# Patient Record
Sex: Female | Born: 1955 | Race: Black or African American | Hispanic: No | Marital: Married | Smoking: Never smoker
Health system: Southern US, Community
[De-identification: ages and names within clinical notes are randomized; demographics above are authoritative.]

## PROBLEM LIST (undated history)

## (undated) DIAGNOSIS — F32A Depression, unspecified: Secondary | ICD-10-CM

## (undated) DIAGNOSIS — E785 Hyperlipidemia, unspecified: Secondary | ICD-10-CM

## (undated) DIAGNOSIS — F419 Anxiety disorder, unspecified: Secondary | ICD-10-CM

## (undated) HISTORY — PX: TONSILLECTOMY: SUR1361

## (undated) HISTORY — DX: Hyperlipidemia, unspecified: E78.5

## (undated) HISTORY — DX: Anxiety disorder, unspecified: F41.9

## (undated) HISTORY — DX: Depression, unspecified: F32.A

---

## 2000-10-15 ENCOUNTER — Other Ambulatory Visit: Admission: RE | Admit: 2000-10-15 | Discharge: 2000-10-15 | Payer: Self-pay | Admitting: Internal Medicine

## 2000-10-24 ENCOUNTER — Encounter: Payer: Self-pay | Admitting: Internal Medicine

## 2000-10-24 ENCOUNTER — Encounter: Admission: RE | Admit: 2000-10-24 | Discharge: 2000-10-24 | Payer: Self-pay | Admitting: Internal Medicine

## 2001-11-03 ENCOUNTER — Encounter: Payer: Self-pay | Admitting: Internal Medicine

## 2001-11-03 ENCOUNTER — Encounter: Admission: RE | Admit: 2001-11-03 | Discharge: 2001-11-03 | Payer: Self-pay | Admitting: Internal Medicine

## 2004-09-01 ENCOUNTER — Encounter: Admission: RE | Admit: 2004-09-01 | Discharge: 2004-09-01 | Payer: Self-pay | Admitting: Internal Medicine

## 2006-07-09 ENCOUNTER — Encounter: Admission: RE | Admit: 2006-07-09 | Discharge: 2006-07-09 | Payer: Self-pay | Admitting: Internal Medicine

## 2007-05-01 ENCOUNTER — Encounter: Admission: RE | Admit: 2007-05-01 | Discharge: 2007-05-01 | Payer: Self-pay | Admitting: Internal Medicine

## 2007-08-26 ENCOUNTER — Ambulatory Visit (HOSPITAL_COMMUNITY): Admission: RE | Admit: 2007-08-26 | Discharge: 2007-08-26 | Payer: Self-pay | Admitting: Family Medicine

## 2008-05-05 ENCOUNTER — Encounter: Admission: RE | Admit: 2008-05-05 | Discharge: 2008-05-05 | Payer: Self-pay | Admitting: Internal Medicine

## 2008-06-01 ENCOUNTER — Encounter: Admission: RE | Admit: 2008-06-01 | Discharge: 2008-06-01 | Payer: Self-pay | Admitting: Internal Medicine

## 2008-11-19 ENCOUNTER — Encounter: Admission: RE | Admit: 2008-11-19 | Discharge: 2008-11-19 | Payer: Self-pay | Admitting: Internal Medicine

## 2012-07-16 ENCOUNTER — Other Ambulatory Visit: Payer: Self-pay | Admitting: Internal Medicine

## 2012-07-16 DIAGNOSIS — Z1231 Encounter for screening mammogram for malignant neoplasm of breast: Secondary | ICD-10-CM

## 2012-08-04 ENCOUNTER — Ambulatory Visit: Payer: Self-pay

## 2012-08-07 ENCOUNTER — Ambulatory Visit
Admission: RE | Admit: 2012-08-07 | Discharge: 2012-08-07 | Disposition: A | Discharge status: Home / Self Care | Payer: BC Managed Care – PPO | Source: Ambulatory Visit | Attending: Internal Medicine | Admitting: Internal Medicine

## 2012-08-07 DIAGNOSIS — Z1231 Encounter for screening mammogram for malignant neoplasm of breast: Secondary | ICD-10-CM

## 2013-08-14 ENCOUNTER — Other Ambulatory Visit: Payer: Self-pay

## 2013-08-14 DIAGNOSIS — Z1231 Encounter for screening mammogram for malignant neoplasm of breast: Secondary | ICD-10-CM

## 2013-09-04 ENCOUNTER — Ambulatory Visit: Payer: BC Managed Care – PPO

## 2013-09-23 ENCOUNTER — Ambulatory Visit
Admission: RE | Admit: 2013-09-23 | Discharge: 2013-09-23 | Disposition: A | Discharge status: Home / Self Care | Payer: BC Managed Care – PPO | Source: Ambulatory Visit

## 2013-09-23 DIAGNOSIS — Z1231 Encounter for screening mammogram for malignant neoplasm of breast: Secondary | ICD-10-CM

## 2013-12-04 ENCOUNTER — Encounter: Payer: Self-pay | Admitting: Internal Medicine

## 2014-02-03 ENCOUNTER — Encounter: Payer: BC Managed Care – PPO | Admitting: Internal Medicine

## 2014-11-03 ENCOUNTER — Other Ambulatory Visit: Payer: Self-pay

## 2014-11-03 DIAGNOSIS — Z1231 Encounter for screening mammogram for malignant neoplasm of breast: Secondary | ICD-10-CM

## 2014-11-08 ENCOUNTER — Ambulatory Visit
Admission: RE | Admit: 2014-11-08 | Discharge: 2014-11-08 | Disposition: A | Discharge status: Home / Self Care | Payer: BC Managed Care – PPO | Source: Ambulatory Visit

## 2014-11-08 DIAGNOSIS — Z1231 Encounter for screening mammogram for malignant neoplasm of breast: Secondary | ICD-10-CM

## 2015-02-16 ENCOUNTER — Other Ambulatory Visit: Payer: Self-pay | Admitting: General Surgery

## 2015-03-14 ENCOUNTER — Ambulatory Visit (HOSPITAL_COMMUNITY): Admission: RE | Admit: 2015-03-14 | Payer: BC Managed Care – PPO | Source: Ambulatory Visit | Admitting: General Surgery

## 2015-03-14 ENCOUNTER — Encounter (HOSPITAL_COMMUNITY): Admission: RE | Payer: Self-pay | Source: Ambulatory Visit

## 2015-03-14 SURGERY — BREATH TEST, FOR HELICOBACTER PYLORI

## 2015-03-19 ENCOUNTER — Ambulatory Visit: Payer: BC Managed Care – PPO | Admitting: Dietician

## 2015-04-04 ENCOUNTER — Ambulatory Visit (HOSPITAL_COMMUNITY): Payer: BC Managed Care – PPO

## 2015-04-04 ENCOUNTER — Ambulatory Visit (HOSPITAL_COMMUNITY): Admission: RE | Admit: 2015-04-04 | Payer: BC Managed Care – PPO | Source: Ambulatory Visit

## 2015-04-04 ENCOUNTER — Other Ambulatory Visit (HOSPITAL_COMMUNITY): Payer: BC Managed Care – PPO

## 2015-04-06 ENCOUNTER — Encounter: Payer: BC Managed Care – PPO | Attending: General Surgery | Admitting: Dietician

## 2015-04-06 ENCOUNTER — Encounter: Payer: Self-pay | Admitting: Dietician

## 2015-04-06 VITALS — Ht 68.5 in | Wt 246.6 lb

## 2015-04-06 DIAGNOSIS — Z713 Dietary counseling and surveillance: Secondary | ICD-10-CM | POA: Insufficient documentation

## 2015-04-06 DIAGNOSIS — Z6836 Body mass index (BMI) 36.0-36.9, adult: Secondary | ICD-10-CM | POA: Insufficient documentation

## 2015-04-06 DIAGNOSIS — E669 Obesity, unspecified: Secondary | ICD-10-CM | POA: Diagnosis present

## 2015-04-06 NOTE — Progress Notes (Signed)
  Pre-Op Assessment Visit:  Pre-Operative RYGB Surgery  Medical Nutrition Therapy:  Appt start time: 2549   End time:  8264.  Patient was seen on 04/06/2015 for Pre-Operative Nutrition Assessment. Assessment and letter of approval faxed to [facility] Bariatric Surgery Program coordinator on 04/06/2015.   Preferred Learning Style:   No preference indicated   Learning Readiness:   Ready  Handouts given during visit include:  Pre-Op Goals Bariatric Surgery Protein Shakes   During the appointment today the following Pre-Op Goals were reviewed with the patient: Maintain or lose weight as instructed by your surgeon Make healthy food choices Begin to limit portion sizes Limited concentrated sugars and fried foods Keep fat/sugar in the single digits per serving on   food labels Practice CHEWING your food  (aim for 30 chews per bite or until applesauce consistency) Practice not drinking 15 minutes before, during, and 30 minutes after each meal/snack Avoid all carbonated beverages  Avoid/limit caffeinated beverages  Avoid all sugar-sweetened beverages Consume 3 meals per day; eat every 3-5 hours Make a list of non-food related activities Aim for 64-100 ounces of FLUID daily  Aim for at least 60-80 grams of PROTEIN daily Look for a liquid protein source that contain ?15 g protein and ?5 g carbohydrate  (ex: shakes, drinks, shots)  Patient-Centered Goals: Judene would like to learn how to swim, prevent illness, and do more activities that she hasn't be able to do before.   9 level of confidence/10 level of importance  Demonstrated degree of understanding via:  Teach Back  Teaching Method Utilized:  Visual Auditory Hands on  Barriers to learning/adherence to lifestyle change: none  Patient to call the [facility] to enroll in Pre-Op and Post-Op Nutrition Education when surgery date is scheduled.

## 2015-04-06 NOTE — Patient Instructions (Signed)

## 2015-04-14 ENCOUNTER — Ambulatory Visit (HOSPITAL_COMMUNITY)
Admission: RE | Admit: 2015-04-14 | Discharge: 2015-04-14 | Disposition: A | Discharge status: Home / Self Care | Payer: BC Managed Care – PPO | Source: Ambulatory Visit | Attending: General Surgery | Admitting: General Surgery

## 2015-04-14 ENCOUNTER — Other Ambulatory Visit: Payer: Self-pay

## 2015-04-14 DIAGNOSIS — K8020 Calculus of gallbladder without cholecystitis without obstruction: Secondary | ICD-10-CM | POA: Insufficient documentation

## 2015-04-14 DIAGNOSIS — K769 Liver disease, unspecified: Secondary | ICD-10-CM | POA: Diagnosis not present

## 2015-04-14 DIAGNOSIS — K760 Fatty (change of) liver, not elsewhere classified: Secondary | ICD-10-CM | POA: Insufficient documentation

## 2015-04-14 DIAGNOSIS — Z6836 Body mass index (BMI) 36.0-36.9, adult: Secondary | ICD-10-CM | POA: Insufficient documentation

## 2015-04-14 DIAGNOSIS — D259 Leiomyoma of uterus, unspecified: Secondary | ICD-10-CM | POA: Insufficient documentation

## 2015-04-22 ENCOUNTER — Ambulatory Visit (HOSPITAL_COMMUNITY)
Admission: RE | Admit: 2015-04-22 | Discharge: 2015-04-22 | Disposition: A | Discharge status: Home / Self Care | Payer: BC Managed Care – PPO | Source: Ambulatory Visit | Attending: General Surgery | Admitting: General Surgery

## 2015-04-22 ENCOUNTER — Encounter (HOSPITAL_COMMUNITY)
Admission: RE | Disposition: A | Discharge status: Home / Self Care | Payer: BC Managed Care – PPO | Source: Ambulatory Visit

## 2015-04-22 DIAGNOSIS — K219 Gastro-esophageal reflux disease without esophagitis: Secondary | ICD-10-CM | POA: Insufficient documentation

## 2015-04-22 HISTORY — PX: BREATH TEK H PYLORI: SHX5422

## 2015-04-22 SURGERY — BREATH TEST, FOR HELICOBACTER PYLORI

## 2015-04-22 NOTE — Progress Notes (Signed)
   04/22/15 0935  BREATH TEK ASSESSMENT  Referring MD Greer Pickerel  Time of Last PO Intake 1200 (04-21-15)  Baseline Breath At: 0754  Pranactin Given At: 4035  Post-Dose Breath At: 0810  Sample 1 2.2  Sample 2 2.5  Test Postive

## 2015-04-25 ENCOUNTER — Encounter (HOSPITAL_COMMUNITY): Payer: Self-pay | Admitting: General Surgery

## 2015-10-24 ENCOUNTER — Other Ambulatory Visit: Payer: Self-pay

## 2015-10-24 DIAGNOSIS — Z1231 Encounter for screening mammogram for malignant neoplasm of breast: Secondary | ICD-10-CM

## 2015-11-11 ENCOUNTER — Ambulatory Visit
Admission: RE | Admit: 2015-11-11 | Discharge: 2015-11-11 | Disposition: A | Discharge status: Home / Self Care | Payer: BC Managed Care – PPO | Source: Ambulatory Visit

## 2015-11-11 DIAGNOSIS — Z1231 Encounter for screening mammogram for malignant neoplasm of breast: Secondary | ICD-10-CM

## 2015-11-13 HISTORY — PX: HERNIA REPAIR: SHX51

## 2015-11-13 HISTORY — PX: LAPAROSCOPIC GASTRIC RESTRICTIVE DUODENAL PROCEDURE (DUODENAL SWITCH): SHX6667

## 2015-11-13 HISTORY — PX: CHOLECYSTECTOMY: SHX55

## 2016-03-20 DIAGNOSIS — E119 Type 2 diabetes mellitus without complications: Secondary | ICD-10-CM | POA: Insufficient documentation

## 2016-04-20 DIAGNOSIS — Z8669 Personal history of other diseases of the nervous system and sense organs: Secondary | ICD-10-CM | POA: Insufficient documentation

## 2016-04-20 DIAGNOSIS — G4730 Sleep apnea, unspecified: Secondary | ICD-10-CM | POA: Insufficient documentation

## 2016-11-08 ENCOUNTER — Other Ambulatory Visit: Payer: Self-pay | Admitting: Internal Medicine

## 2016-11-08 DIAGNOSIS — Z1231 Encounter for screening mammogram for malignant neoplasm of breast: Secondary | ICD-10-CM

## 2016-11-15 ENCOUNTER — Ambulatory Visit
Admission: RE | Admit: 2016-11-15 | Discharge: 2016-11-15 | Disposition: A | Discharge status: Home / Self Care | Payer: BC Managed Care – PPO | Source: Ambulatory Visit | Attending: Internal Medicine | Admitting: Internal Medicine

## 2016-11-15 DIAGNOSIS — Z1231 Encounter for screening mammogram for malignant neoplasm of breast: Secondary | ICD-10-CM

## 2017-06-06 DIAGNOSIS — R5383 Other fatigue: Secondary | ICD-10-CM

## 2017-06-06 DIAGNOSIS — R001 Bradycardia, unspecified: Secondary | ICD-10-CM

## 2017-06-06 HISTORY — DX: Bradycardia, unspecified: R00.1

## 2017-06-06 HISTORY — DX: Other fatigue: R53.83

## 2017-06-10 ENCOUNTER — Telehealth: Payer: Self-pay | Admitting: Physician Assistant

## 2017-06-10 NOTE — Telephone Encounter (Signed)
Received records from [facility] for appointment on 06/18/17 with Rosaria Ferries, PA.  Records put with Rhonda's schedule for 06/18/17. lp

## 2017-06-18 ENCOUNTER — Encounter: Payer: Self-pay | Admitting: Physician Assistant

## 2017-06-18 ENCOUNTER — Ambulatory Visit (INDEPENDENT_AMBULATORY_CARE_PROVIDER_SITE_OTHER): Payer: BC Managed Care – PPO | Admitting: Physician Assistant

## 2017-06-18 VITALS — BP 114/78 | HR 46 | Ht 68.0 in | Wt 177.0 lb

## 2017-06-18 DIAGNOSIS — R5383 Other fatigue: Secondary | ICD-10-CM

## 2017-06-18 DIAGNOSIS — R001 Bradycardia, unspecified: Secondary | ICD-10-CM

## 2017-06-18 NOTE — Progress Notes (Addendum)
Cardiology Office Note   Date:  06/18/2017   ID:  Valerie Stuart, Valerie Stuart Jun 04, 1956, MRN [id number]  PCP:  Minette Brine  Cardiologist:  New, will be Dr Stacy Gardner, PA-C   Chief Complaint  Patient presents with  . Bradycardia  . Fatigue    History of Present Illness: Valerie Stuart is a 61 y.o. female with a history of morbid obesity s/p duodenal switch w/ bilio-pancreatic diversion and wt loss 80 lbs, HLD, anxiety/depression.   Valerie Stuart presents for cardiology evaluation because of bradycardia.  She has been tired for months. She thought she would feel more energetic after the weight loss, but does not. She has had to pull over because of getting tired and sleepy while driving. The last Time was yesterday, she was driving home to [place] from [place], not a long trip.  She feels the fatigue has gotten worse in the last few months. She has been known to have a low HR, in the 50s, but her heart rate has gotten lower.  She had a colonoscopy 05/20/2017 and her HR was noted to be 41. Her PCP also noted that her HR is running lower than previous, leading to cardiology evaluation.  She had been on Celexa for years, was changed to Trintellix 03/2017 because of lack of effectiveness and decreased libido. She was physically dependent on it, but did not wean off, stopped suddenly. She started the Trintellix at 5 mg, increased to 10 mg but felt more tired and is now back on 5 mg qd. She does not feel as tired on the 5 mg tabs, but the fatigue is still significant. She is aware that depression can cause fatigue, but feels the amount of fatigue is out of proportion to the depression which is improved by the Trintellix.  Her labs are followed by Dr Osvaldo Human at [facility] in [place]. She takes supplements to keep her vitamin levels up.  She does not sleep well. She wakes every morning about 4 am and cannot get back to sleep. She does not have trouble getting to sleep. She  was tested for sleep apnea prior to weight loss and only had a mild case. Her husband no longer mentions her snoring.    Past Medical History:  Diagnosis Date  . Hyperlipidemia     Past Surgical History:  Procedure Laterality Date  . BREATH TEK H PYLORI N/A 04/22/2015   Procedure: BREATH TEK H PYLORI;  Surgeon: Greer Pickerel, MD;  Location: [facility];  Service: General;  Laterality: N/A;  . 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 TONSILLECTOMY      Current Outpatient Prescriptions  Medication Sig Dispense Refill  . calcium citrate (CALCITRATE - DOSED IN MG ELEMENTAL CALCIUM) 950 MG tablet Take 200 mg of elemental calcium by mouth 4 (four) times daily.    . calcium-vitamin D (OSCAL WITH D) 500-200 MG-UNIT tablet Take 1 tablet by mouth daily with breakfast.    . cyanocobalamin 500 MCG tablet Take 500 mcg by mouth daily.    . Multiple Vitamin (MULTIVITAMIN) tablet Take 1 tablet by mouth daily.    Marland Kitchen vortioxetine HBr (TRINTELLIX) 10 MG TABS Take 1 tablet by mouth daily.     No current facility-administered medications for this visit.     Allergies:   Bee venom    Social History:  The patient  reports that she has never smoked. She has never used smokeless  tobacco.   Family History:  The patient's family history includes Alzheimer's disease in her father; Heart disease in her mother; Hypertension in her brother and mother.    ROS:  Please see the history of present illness. All other systems are reviewed and negative.    PHYSICAL EXAM: VS:  BP 114/78 (BP Location: Right Arm)   Pulse (!) 46   Ht 5\' 8"  (1.727 m)   Wt 177 lb (80.3 kg)   BMI 26.91 kg/m  , BMI Body mass index is 26.91 kg/m. GEN: Well nourished, well developed, female in no acute distress  HEENT: normal for age  Neck: no JVD, no carotid bruit, no masses Cardiac: RRR; Very soft murmur, no rubs, or gallops Respiratory:  clear to auscultation  bilaterally, normal work of breathing GI: soft, nontender, nondistended, + BS MS: no deformity or atrophy; no edema; distal pulses are 2+ in all 4 extremities   Skin: warm and dry, no rash Neuro:  Strength and sensation are intact Psych: euthymic mood, full affect   EKG:  EKG is ordered today. The ekg ordered today demonstrates sinus bradycardia, heart rate 46, normal intervals and no ischemic changes   Recent Labs: She had labs recently done at her PCP office No results found for requested labs within last 8760 hours.    Lipid Panel No results found for: CHOL, TRIG, HDL, CHOLHDL, VLDL, LDLCALC, LDLDIRECT   Wt Readings from Last 3 Encounters:  06/18/17 177 lb (80.3 kg)  04/06/15 246 lb 9.6 oz (111.9 kg)     Other studies Reviewed: Additional studies/ records that were reviewed today include: Office notes and Care Everywhere records.  ASSESSMENT AND PLAN: Dr. Gwenlyn Found reviewed the patient data and agrees with the plan.  1.  Fatigue: It is not clear that her heart rate is accounting for the fatigue. I will check other possible causes of fatigue including making sure that she has had a TSH checked. We will do a treadmill to check for chronotropic incompetence which would cause fatigue.  Upon looking up Trintellix, there is a weak association between this medication and bradycardia. It is possible that if she has underlying sinus bradycardia and the medication makes that a little worse, it could contribute to her fatigue. She is also encouraged to see what she can do about getting more sleep. Lack of rest could certainly contribute to fatigue. She does not feel her depression is causing the fatigue.  2. Bradycardia: A heart rate in the 40s could make her a little tired. As above, we will make sure that she does not have chronotropic incompetence. She is not on any medications besides the Trintellix and some vitamins, so I do not have any iatrogenic causes. We will check thyroid if this is  not been done. We will check an echocardiogram to rule out structural heart disease. We will get an event monitor to see if her heart rate is dropping lower overnight. Check a calcium level as well.  Follow up after all data are obtained.   Current medicines are reviewed at length with the patient today.  The patient does not have concerns regarding medicines.  The following changes have been made:  no change  Labs/ tests ordered today include:   Orders Placed This Encounter  Procedures  . Cardiac event monitor  . Exercise Tolerance Test  . EKG 12-Lead  . ECHOCARDIOGRAM COMPLETE     Disposition:   FU with Dr. Gwenlyn Found or Dr. Debara Pickett if she prefers (he sees  her mother)  Augusto Garbe  06/18/2017 9:40 AM    [place] Phone: [phone]; Fax: [phone]  This note was written with the assistance of speech recognition software. Please excuse any transcriptional errors.

## 2017-06-18 NOTE — Progress Notes (Signed)
Labs received from [facility] were drawn 05/28/2017. Vitamin D level was a little low, TSH was within normal limits, A1c lipid profile were within normal limits. No significant abnormalities on complete metabolic profile and CBC.  Lenoard Aden 06/18/2017 5:42 PM [facility] [phone]

## 2017-06-18 NOTE — Patient Instructions (Signed)
Medication Instructions:  Your physician recommends that you continue on your current medications as directed. Please refer to the Current Medication list given to you today. If you need a refill on your cardiac medications before your next appointment, please call your pharmacy.  Labwork: WE WILL CALL YOU IF ANY ARE NEEDED  Testing/Procedures: Your physician has requested that you have an exercise tolerance test. For further information please visit [email or web address]. Please also follow instruction sheet, as given.  Your physician has requested that you have an echocardiogram. Echocardiography is a painless test that uses sound waves to create images of your heart. It provides your doctor with information about the size and shape of your heart and how well your heart's chambers and valves are working. This procedure takes approximately one hour. There are no restrictions for this procedure.  Your physician has recommended that you wear an event monitor. Event monitors are medical devices that record the heart's electrical activity. Doctors most often [place] these monitors to diagnose arrhythmias. Arrhythmias are problems with the speed or rhythm of the heartbeat. The monitor is a small, portable device. You can wear one while you do your normal daily activities. This is usually used to diagnose what is causing palpitations/syncope (passing out).  Follow-Up: Your physician wants you to follow-up in: [place].   Thank you for choosing [facility]!!

## 2017-07-03 ENCOUNTER — Ambulatory Visit (INDEPENDENT_AMBULATORY_CARE_PROVIDER_SITE_OTHER): Payer: BC Managed Care – PPO

## 2017-07-03 ENCOUNTER — Other Ambulatory Visit: Payer: Self-pay

## 2017-07-03 ENCOUNTER — Telehealth (HOSPITAL_COMMUNITY): Payer: Self-pay

## 2017-07-03 ENCOUNTER — Ambulatory Visit (HOSPITAL_COMMUNITY): Payer: BC Managed Care – PPO | Attending: Cardiology

## 2017-07-03 DIAGNOSIS — E785 Hyperlipidemia, unspecified: Secondary | ICD-10-CM | POA: Diagnosis not present

## 2017-07-03 DIAGNOSIS — I071 Rheumatic tricuspid insufficiency: Secondary | ICD-10-CM | POA: Insufficient documentation

## 2017-07-03 DIAGNOSIS — R001 Bradycardia, unspecified: Secondary | ICD-10-CM

## 2017-07-03 NOTE — Telephone Encounter (Signed)
Encounter complete. 

## 2017-07-05 ENCOUNTER — Ambulatory Visit (HOSPITAL_COMMUNITY)
Admission: RE | Admit: 2017-07-05 | Discharge: 2017-07-05 | Disposition: A | Discharge status: Home / Self Care | Payer: BC Managed Care – PPO | Source: Ambulatory Visit | Attending: Internal Medicine | Admitting: Internal Medicine

## 2017-07-05 DIAGNOSIS — R001 Bradycardia, unspecified: Secondary | ICD-10-CM | POA: Diagnosis not present

## 2017-07-05 LAB — EXERCISE TOLERANCE TEST
CHL CUP MPHR: 162 {beats}/min
CSEPPHR: 155 {beats}/min
Estimated workload: 8.5 METS
Exercise duration (min): 7 min
Exercise duration (sec): 0 s
Percent HR: 95 %
RPE: 18
Rest HR: 49 {beats}/min

## 2017-07-31 ENCOUNTER — Ambulatory Visit (INDEPENDENT_AMBULATORY_CARE_PROVIDER_SITE_OTHER): Payer: BC Managed Care – PPO | Admitting: Cardiovascular Disease

## 2017-07-31 ENCOUNTER — Encounter: Payer: Self-pay | Admitting: Cardiovascular Disease

## 2017-07-31 DIAGNOSIS — R001 Bradycardia, unspecified: Secondary | ICD-10-CM | POA: Diagnosis not present

## 2017-07-31 NOTE — Patient Instructions (Signed)
REFER TO DR Sallyanne Kuster TO DISCUSS PACEMAKER  Your physician wants you to follow-up in: 6 MONTHS WITH DR Gwenlyn Found You will receive a reminder letter in the mail two months in advance. If you don't receive a letter, please call our office to schedule the follow-up appointment.   If you need a refill on your cardiac medications before your next appointment, please call your pharmacy.

## 2017-07-31 NOTE — Assessment & Plan Note (Addendum)
Valerie Stuart was referred for fatigue. She has had bariatric surgery with an excellent result (80 pound weight loss). She saw Rosaria Ferries in the office for initial consultation on 06/18/17. Routine GXT was normal as was a 2-D echo. Monitor showed episodes of sinus bradycardia without minimal heart rate of 38 and frequent heart rates in the low 40s. She is on no weight low lowering drugs. Is possible that her bradycardia is contributing to her fatigue. I'm going to refer her to Dr. Sallyanne Kuster for discussion regarding the possibility of permanent transvenous pacemaker. It should be noted that her TSH was normal on recent labs ordered by her PCP.

## 2017-07-31 NOTE — Progress Notes (Signed)
07/31/2017 Orion Modest   03-14-1956  [id number]  Primary Physician Minette Brine Primary Cardiologist: Lorretta Harp MD Lupe Carney, [place]  HPI:  Valerie Stuart is a 61 y.o. 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 She was referred by Minette Brine NP/Dr  . Bryon Lions for cardiovascular evaluation initially because of fatigue. She has basically no cardiac risk factors. She has had bariatric surgery and has lost 80 pounds. She saw Rosaria Ferries PA-C in the office on 06/18/17 who ordered a routine GXT which was normal as was a 2-D echo. Event monitor showed heart rates as low as 38 with frequent heart rates in the low 40s. She is on no rate lowering medications. She was tested for sleep apnea prior to bariatric surgery and this was minimally positive.   Current Meds  Medication Sig  . calcium citrate (CALCITRATE - DOSED IN MG ELEMENTAL CALCIUM) 950 MG tablet Take 200 mg of elemental calcium by mouth 4 (four) times daily.  . calcium-vitamin D (OSCAL WITH D) 500-200 MG-UNIT tablet Take 1 tablet by mouth daily with breakfast.  . Cholecalciferol (D-3-5) 5000 units capsule Take 5,000 Units by mouth daily.  . cyanocobalamin 500 MCG tablet Take 500 mcg by mouth daily.  Marland Kitchen IRON-B12-VIT C-FA-IFC PO Take 29 mg by mouth daily.  . Multiple Vitamin (MULTIVITAMIN) tablet Take 1 tablet by mouth daily.  . vitamin A 10000 UNIT capsule Take 10,000 Units by mouth daily.  Marland Kitchen VITAMIN E PO Take 300 Units by mouth daily.  Marland Kitchen vortioxetine HBr (TRINTELLIX) 10 MG TABS Take 1 tablet by mouth daily.     Allergies  Allergen Reactions  . Bee Venom Anaphylaxis    Social History   Social History  . Marital status: Married    Spouse name: N/A  . Number of children: N/A  . Years of education: N/A   Occupational History  . Checks group homes for compliance [place] History Main Topics  . Smoking status: Never Smoker   . Smokeless tobacco: Never Used  . Alcohol use No  . Drug use: No  . Sexual activity: Yes   Other Topics Concern  . Not on file   Social History Narrative  . No narrative on file     Review of Systems: General: negative for chills, fever, night sweats or weight changes.  Cardiovascular: negative for chest pain, dyspnea on exertion, edema, orthopnea, palpitations, paroxysmal nocturnal dyspnea or shortness of breath Dermatological: negative for rash Respiratory: negative for cough or wheezing Urologic: negative for hematuria Abdominal: negative for nausea, vomiting, diarrhea, bright red blood per rectum, melena, or hematemesis Neurologic: negative for visual changes, syncope, or dizziness All other systems reviewed and are otherwise negative except as noted above.    Blood pressure 111/72, pulse (!) 55, height 5\' 9"  (1.753 m), weight 173 lb 6.4 oz (78.7 kg), SpO2 100 %.  General appearance: alert and no distress Neck: no adenopathy, no carotid bruit, no JVD, supple, symmetrical, trachea midline and thyroid not enlarged, symmetric, no tenderness/mass/nodules Lungs: clear to auscultation bilaterally Heart: regular rate and rhythm, S1, S2 normal, no murmur, click, rub or gallop Extremities: extremities normal, atraumatic, no cyanosis or edema Pulses: 2+ and symmetric Skin: Skin color, texture, turgor normal. No rashes or lesions Neurologic: Alert and oriented X 3, normal strength and tone. Normal symmetric reflexes. Normal coordination and gait  EKG not performed today  ASSESSMENT AND PLAN:  Bradycardia, unspecified Ms. Valerie Stuart was referred for fatigue. She has had bariatric surgery with an excellent result (80 pound weight loss). She saw Rosaria Ferries in the office for initial consultation on 06/18/17. Routine GXT was normal as was a 2-D echo. Monitor showed episodes of sinus bradycardia without minimal heart rate of 38 and frequent heart rates in the low 40s. She is on no weight  low lowering drugs. Is possible that her bradycardia is contributing to her fatigue. I'm going to refer her to Dr. Sallyanne Kuster for discussion regarding the possibility of permanent transvenous pacemaker. It should be noted that her TSH was normal on recent labs ordered by her PCP.      Lorretta Harp MD FACP,FACC,FAHA, [facility] 07/31/2017 9:44 AM

## 2017-08-05 ENCOUNTER — Ambulatory Visit (INDEPENDENT_AMBULATORY_CARE_PROVIDER_SITE_OTHER): Payer: BC Managed Care – PPO | Admitting: Cardiovascular Disease

## 2017-08-05 ENCOUNTER — Encounter: Payer: Self-pay | Admitting: Cardiovascular Disease

## 2017-08-05 VITALS — BP 130/72 | HR 53 | Ht 69.0 in | Wt 174.0 lb

## 2017-08-05 DIAGNOSIS — R001 Bradycardia, unspecified: Secondary | ICD-10-CM

## 2017-08-05 NOTE — Patient Instructions (Signed)
Dr Sallyanne Kuster recommends that you schedule a follow-up appointment in 6 months. You will receive a reminder letter in the mail two months in advance. If you don't receive a letter, please call our office to schedule the follow-up appointment.  If you need a refill on your cardiac medications before your next appointment, please call your pharmacy.   Kardia device - [email or web address]

## 2017-08-05 NOTE — Progress Notes (Signed)
Cardiology Office Note:    Date:  08/06/2017   ID:  [name], DOB [birth year], MRN [id number]  PCP:  Minette Brine  Cardiologist: Quay Burow, M.D.; Sanda Klein, MD    Referring MD: Minette Brine, FNP   Chief Complaint  Patient presents with  . Follow-up  Discussed pacemaker  History of Present Illness:    Valerie Stuart is a 61 y.o. female with a hx of Persistent sinus bradycardia and complaints of fatigue, referred for Dr. Gwenlyn Found to discuss pacemaker implantation.  Mrs. Burgoon underwent bariatric surgery (duodenal switch) procedure about a year ago and has successfully lost a large amount of weight, at least 80 pounds. She had been hoping that weight loss would lead to substantial boost in her energy and resolution of fatigue. This has not happened. She does not have daytime hypersomnolence, does not snore and does not have significant obstructive sleep apnea (sleep study performed before her weight loss was "minimally positive"). She has normal thyroid function and is not anemic.  Recent event monitor shows persistent sinus bradycardia including during daytime hours. The minimum recorded heart rate was 38 and she frequently had heart rates in the 40s even during the daytime. No significant pauses were recorded.  However, during recent exercise testing she achieved a maximum heart rate of 155 bpm (95% of maximum predicted) relatively easily. Her stress test was otherwise normal. She was able to exercise for 7 minutes (8.5 mets). She had a normal echocardiogram in August.  Past Medical History:  Diagnosis Date  . Hyperlipidemia     Past Surgical History:  Procedure Laterality Date  . BREATH TEK H PYLORI N/A 04/22/2015   Procedure: BREATH TEK H PYLORI;  Surgeon: Greer Pickerel, MD;  Location: [facility];  Service: General;  Laterality: N/A;  . 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 TONSILLECTOMY       Current Medications: Current Meds  Medication Sig  . calcium citrate (CALCITRATE - DOSED IN MG ELEMENTAL CALCIUM) 950 MG tablet Take 200 mg of elemental calcium by mouth 4 (four) times daily.  . calcium-vitamin D (OSCAL WITH D) 500-200 MG-UNIT tablet Take 1 tablet by mouth daily with breakfast.  . Cholecalciferol (D-3-5) 5000 units capsule Take 5,000 Units by mouth daily.  . cyanocobalamin 500 MCG tablet Take 500 mcg by mouth daily.  Marland Kitchen IRON-B12-VIT C-FA-IFC PO Take 29 mg by mouth daily.  . Multiple Vitamin (MULTIVITAMIN) tablet Take 1 tablet by mouth daily.  . vitamin A 10000 UNIT capsule Take 10,000 Units by mouth daily.  Marland Kitchen VITAMIN E PO Take 300 Units by mouth daily.  Marland Kitchen vortioxetine HBr (TRINTELLIX) 10 MG TABS Take 1 tablet by mouth daily.     Allergies:   Bee venom   Social History   Social History  . Marital status: Married    Spouse name: N/A  . Number of children: N/A  . Years of education: N/A   Occupational History  . 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 Smoking status: Never Smoker  . Smokeless tobacco: Never Used  . Alcohol use No  . Drug use: No  . Sexual activity: Yes   Other Topics Concern  . None   Social History Narrative  . None     Family History: The patient's family history includes Alzheimer's disease in her father; Heart disease in her mother; Hypertension in her brother and mother.  ROS:   Please see the history of present illness.     All other systems reviewed and are negative.  EKGs/Labs/Other Studies Reviewed:    The following studies were reviewed today:   EKG:  EKG is  ordered today.  The ekg ordered today demonstrates sinus bradycardia 53 bpm, otherwise normal tracing.  Recent Labs: No results found for requested labs within last 8760 hours.  Recent Lipid Panel No results found for: CHOL, TRIG, HDL, CHOLHDL, VLDL, LDLCALC, LDLDIRECT  Physical Exam:    VS:  BP 130/72    Pulse (!) 53   Ht 5\' 9"  (1.753 m)   Wt 174 lb (78.9 kg)   BMI 25.70 kg/m     Wt Readings from Last 3 Encounters:  08/05/17 174 lb (78.9 kg)  07/31/17 173 lb 6.4 oz (78.7 kg)  06/18/17 177 lb (80.3 kg)     GEN:  Well nourished, well developed in no acute distress HEENT: Normal NECK: No JVD; No carotid bruits LYMPHATICS: No lymphadenopathy CARDIAC: RRR, no murmurs, rubs, gallops RESPIRATORY:  Clear to auscultation without rales, wheezing or rhonchi  ABDOMEN: Soft, non-tender, non-distended MUSCULOSKELETAL:  No edema; No deformity  SKIN: Warm and dry NEUROLOGIC:  Alert and oriented x 3 PSYCHIATRIC:  Normal affect   ASSESSMENT:    No diagnosis found. PLAN:    In order of problems listed above:  1. Sinus bradycardia - Although she clearly has significant resting bradycardia, she does not have any evidence of chronotropic incompetence. She has not had syncope or near syncope. None of her medications have negative chronotropic effect. Normal recent thyroid tests. It is highly likely that her sinus bradycardia is a consequence of gastric bypass surgery. This is a fairly common occurrence and generally does not require treatment. It often correlates with rapid weight loss. There are reports of successful treatment with anti-cholinergic such as scopolamine patch. She is reluctant to consider implantation of a permanent pacemaker at such a young age. Also, I could not promise that the device would lead to improved symptoms. We did review the procedure in detail so that she understands the pros and cons. I would ask her to consider repeat evaluation if she develops clear-cut syncope or near-syncope.   Medication Adjustments/Labs and Tests Ordered: Current medicines are reviewed at length with the patient today.  Concerns regarding medicines are outlined above.  No orders of the defined types were placed in this encounter.  No orders of the defined types were placed in this  encounter.   Signed, Sanda Klein, MD  08/06/2017 9:04 AM    [facility]

## 2017-08-16 ENCOUNTER — Ambulatory Visit: Payer: BC Managed Care – PPO | Admitting: Cardiovascular Disease

## 2017-08-28 ENCOUNTER — Ambulatory Visit: Payer: BC Managed Care – PPO | Admitting: Cardiovascular Disease

## 2017-09-03 ENCOUNTER — Ambulatory Visit: Payer: BC Managed Care – PPO | Admitting: Cardiovascular Disease

## 2018-05-27 ENCOUNTER — Other Ambulatory Visit: Payer: Self-pay | Admitting: Internal Medicine

## 2018-05-27 DIAGNOSIS — Z1231 Encounter for screening mammogram for malignant neoplasm of breast: Secondary | ICD-10-CM

## 2018-06-17 ENCOUNTER — Ambulatory Visit: Payer: BC Managed Care – PPO

## 2018-07-08 ENCOUNTER — Ambulatory Visit
Admission: RE | Admit: 2018-07-08 | Discharge: 2018-07-08 | Disposition: A | Discharge status: Home / Self Care | Payer: BC Managed Care – PPO | Source: Ambulatory Visit | Attending: Internal Medicine | Admitting: Internal Medicine

## 2018-07-08 DIAGNOSIS — Z1231 Encounter for screening mammogram for malignant neoplasm of breast: Secondary | ICD-10-CM

## 2018-10-03 ENCOUNTER — Ambulatory Visit: Payer: BC Managed Care – PPO | Admitting: Nurse Practitioner

## 2018-10-03 ENCOUNTER — Encounter: Payer: Self-pay | Admitting: Nurse Practitioner

## 2018-10-03 VITALS — BP 122/76 | HR 73 | Temp 97.7°F | Ht 67.0 in | Wt 171.8 lb

## 2018-10-03 DIAGNOSIS — F32A Depression, unspecified: Secondary | ICD-10-CM

## 2018-10-03 DIAGNOSIS — F419 Anxiety disorder, unspecified: Secondary | ICD-10-CM | POA: Diagnosis not present

## 2018-10-03 DIAGNOSIS — Z23 Encounter for immunization: Secondary | ICD-10-CM | POA: Diagnosis not present

## 2018-10-03 DIAGNOSIS — F329 Major depressive disorder, single episode, unspecified: Secondary | ICD-10-CM

## 2018-10-03 NOTE — Patient Instructions (Signed)
Agoraphobia Agoraphobia is a mental health disorder. It is a type of anxiety or fear. People with agoraphobia fear public places where they may be trapped, helpless, or embarrassed in the event of a panic attack or a loss of control. They often start to avoid the feared situations or insist that another person go with them. Agoraphobia may interfere with normal daily activities and personal relationships. People with severe agoraphobia may become completely homebound and dependent on others for grocery shopping and other errands. Agoraphobia usually begins before age 35, but it can start in the older adult years. People with agoraphobia are at risk for other anxiety disorders, depression, and substance abuse. What are the causes? It is not known exactly what causes agoraphobia. What increases the risk? Agoraphobia is more common in women. People who have panic disorder or have family members with agoraphobia are at higher risk of developing agoraphobia. What are the signs or symptoms? You may have agoraphobia if you have the following symptoms for 6 months or longer:  Intense fear about two or more of the following: ? Using public transportation, such as cars, buses, planes, trains, or ships. ? Being in open spaces, such as parking lots, shopping malls, or bridges. ? Being in enclosed spaces, such as shops, theaters, or elevators. ? Standing in line or being in a crowd. ? Being outside the home alone.  Fear that is due to thoughts of being unable to escape or get help if certain events occur. The feared event may be a panic attack or panic-like symptoms, such as a racing heart, dizziness, and trouble breathing. In older people, the feared event may be a fall or loss of bowel control.  Reacting to feared situations by: ? Avoiding them. ? Requiring the presence of a companion. ? Enduring them with intense fear or anxiety.  Fear or anxiety that is out of proportion to the actual danger that is  posed by the event and the situation.  How is this diagnosed? Agoraphobia may be diagnosed by your health care provider. You will be asked questions about your fears and how they have affected you. You may be asked about your medical history and your use of medicines, alcohol, or drugs. Your health care provider may do a physical exam and order lab tests or other studies to rule out a medical condition. You may also be referred to a mental health specialist. How is this treated? Treatment usually includes a combination of counseling and medicines.  Counseling or talk therapy. Talk therapy is provided by mental health specialists. The following forms of talk therapy can be especially helpful: ? Cognitive therapy. Cognitive therapy helps you to recognize and change unrealistic thoughts and beliefs that contribute to your fears. ? Exposure therapy. Exposure therapy helps you to face and overcome your fears in a relaxed state and a safe environment.  Medicines. The following types of medicines may be helpful: ? Antidepressants. Antidepressants are believed to affect certain chemicals in your brain. They can decrease general levels of anxiety and can help to prevent panic attacks. ? Benzodiazepines. These medicines block feelings of anxiety and panic. They are very effective and act more quickly than antidepressants, but they are highly addictive. These medicines are recommended only for short-term use. ? Beta blockers. Beta blockers can reduce physical symptoms of anxiety, such as a racing heart, sweating, and tremor. They may help you to feel less tense and anxious.  Follow these instructions at home:  Keep all follow-up visits as   directed by your health care provider. This is important.  Take all medicines only as directed by your health care provider.  Try to exercise, eat a healthy diet, and get plenty of sleep.  Do not drink alcohol.  Do not use illegal drugs. Where to find more  information: For more information, visit the website of the Anxiety and Depression Association of [employer] (ADAA): www.adaa.org Contact a health care provider if:  Your fear or anxiety gets worse.  You have new fears or anxieties. Get help right away if:  You have serious thoughts about hurting yourself or someone else.  You have trouble breathing or have chest pain. This information is not intended to replace advice given to you by your health care provider. Make sure you discuss any questions you have with your health care provider. Document Released: 03/21/2011 Document Revised: 04/05/2016 Document Reviewed: 03/01/2014 Elsevier Interactive Patient Education  2018 Elsevier Inc.  

## 2018-10-03 NOTE — Progress Notes (Signed)
Subjective:     Patient ID: Valerie Stuart , female    DOB: 1956-09-15 , 62 y.o.   MRN: [id number]   Chief Complaint  Patient presents with  . Anxiety  . URI    HPI  Anxiety  Presents for follow-up visit. Symptoms include insomnia and nervous/anxious behavior. Patient reports no chest pain, depressed mood, dizziness, nausea or palpitations. The quality of sleep is good. Nighttime awakenings: none.    URI   This is a new problem. The current episode started in the past 7 days. There has been no fever. Pertinent negatives include no chest pain, congestion, nausea or sore throat. Associated symptoms comments: Had sorethroat on Saturday, better now. . She has tried sleep for the symptoms. The treatment provided no relief.     Past Medical History:  Diagnosis Date  . Hyperlipidemia      Family History  Problem Relation Age of Onset  . Heart disease Mother   . Hypertension Mother   . Alzheimer's disease Father   . Hypertension Brother      Current Outpatient Medications:  .  calcium-vitamin D (OSCAL WITH D) 500-200 MG-UNIT tablet, Take 1 tablet by mouth daily with breakfast., Disp: , Rfl:  .  Cholecalciferol (D-3-5) 5000 units capsule, Take 5,000 Units by mouth daily., Disp: , Rfl:  .  cyanocobalamin 500 MCG tablet, Take 500 mcg by mouth daily., Disp: , Rfl:  .  IRON-B12-VIT C-FA-IFC PO, Take 29 mg by mouth daily., Disp: , Rfl:  .  Multiple Vitamin (MULTIVITAMIN) tablet, Take 1 tablet by mouth daily., Disp: , Rfl:  .  vitamin A 10000 UNIT capsule, Take 10,000 Units by mouth daily., Disp: , Rfl:  .  VITAMIN E PO, Take 300 Units by mouth daily., Disp: , Rfl:  .  vortioxetine HBr (TRINTELLIX) 10 MG TABS, Take 1 tablet by mouth daily., Disp: , Rfl:  .  calcium citrate (CALCITRATE - DOSED IN MG ELEMENTAL CALCIUM) 950 MG tablet, Take 200 mg of elemental calcium by mouth 4 (four) times daily., Disp: , Rfl:    Allergies  Allergen Reactions  . Bee Venom Anaphylaxis     Review of  Systems  HENT: Negative for congestion and sore throat.   Respiratory: Negative.   Cardiovascular: Negative for chest pain and palpitations.  Gastrointestinal: Negative for nausea.  Skin: Negative.   Neurological: Negative for dizziness and light-headedness.  Psychiatric/Behavioral: Positive for sleep disturbance. Negative for agitation and behavioral problems. The patient is nervous/anxious and has insomnia.      Today's Vitals   10/03/18 0842  BP: 122/76  Pulse: 73  Temp: 97.7 F (36.5 C)  SpO2: 93%  Weight: 171 lb 12.8 oz (77.9 kg)  Height: 5\' 7"  (1.702 m)  PainSc: 0-No pain   Body mass index is 26.91 kg/m.   Objective:  Physical Exam  Constitutional: She is oriented to person, place, and time. She appears well-developed.  Cardiovascular: Normal rate, regular rhythm and normal heart sounds.  Pulmonary/Chest: Effort normal and breath sounds normal.  Neurological: She is alert and oriented to person, place, and time.  Skin: Skin is warm and dry.  Psychiatric: She has a normal mood and affect. Her behavior is normal. Judgment and thought content normal.       Assessment And Plan:     1. Anxiety  Chronic, she is feeling better a little on Trintellix 10 mg  Encouraged to take Magnesium 250 mg with evening meal this may help her with sleep as  well.   2. Depression, unspecified depression type  Chronic  Depression score is 1  She is planning to go to counseling known as [facility]  3. Need for influenza vaccination  Influenza vaccine administered  Encouraged to take Tylenol as needed for fever or muscle aches. - Flu Vaccine QUAD 6+ mos PF IM (Fluarix Quad PF)       Minette Brine, FNP

## 2018-10-30 ENCOUNTER — Other Ambulatory Visit: Payer: Self-pay

## 2018-10-30 MED ORDER — VORTIOXETINE HBR 10 MG PO TABS: 10.0000 mg | ORAL_TABLET | Freq: Every day | ORAL | 1 refills | Status: DC

## 2018-11-21 ENCOUNTER — Encounter: Payer: Self-pay | Admitting: Nurse Practitioner

## 2018-11-21 ENCOUNTER — Ambulatory Visit: Payer: BC Managed Care – PPO | Admitting: Nurse Practitioner

## 2018-11-21 VITALS — BP 110/60 | HR 60 | Temp 97.9°F | Ht 67.0 in | Wt 174.6 lb

## 2018-11-21 DIAGNOSIS — R350 Frequency of micturition: Secondary | ICD-10-CM | POA: Diagnosis not present

## 2018-11-21 LAB — POCT URINALYSIS DIPSTICK
Bilirubin, UA: NEGATIVE
Glucose, UA: NEGATIVE
Ketones, UA: NEGATIVE
NITRITE UA: NEGATIVE
PH UA: 5.5 (ref 5.0–8.0)
Protein, UA: NEGATIVE
SPEC GRAV UA: 1.015 (ref 1.010–1.025)
UROBILINOGEN UA: 0.2 U/dL

## 2018-11-21 MED ORDER — CIPROFLOXACIN HCL 500 MG PO TABS: 500.0000 mg | ORAL_TABLET | Freq: Two times a day (BID) | ORAL | 0 refills | Status: AC

## 2018-11-21 NOTE — Progress Notes (Signed)
Subjective:     Patient ID: Valerie Stuart , female    DOB: 17-Mar-1956 , 63 y.o.   MRN: [id number]   Chief Complaint  Patient presents with  . Dysuria    patient states she took some azo yesterday morning    HPI  Dysuria   This is a new problem. The current episode started yesterday. The problem occurs every urination. The problem has been gradually worsening. Quality: pressure to back area. The pain is at a severity of 3/10. There has been no fever. She is sexually active. There is no history of pyelonephritis. Pertinent negatives include no chills. She has tried nothing for the symptoms. The treatment provided no relief. There is no history of recurrent UTIs.     Past Medical History:  Diagnosis Date  . Hyperlipidemia      Family History  Problem Relation Age of Onset  . Heart disease Mother   . Hypertension Mother   . Alzheimer's disease Father   . Hypertension Brother      Current Outpatient Medications:  .  calcium citrate (CALCITRATE - DOSED IN MG ELEMENTAL CALCIUM) 950 MG tablet, Take 200 mg of elemental calcium by mouth 4 (four) times daily., Disp: , Rfl:  .  calcium-vitamin D (OSCAL WITH D) 500-200 MG-UNIT tablet, Take 1 tablet by mouth daily with breakfast., Disp: , Rfl:  .  Cholecalciferol (D-3-5) 5000 units capsule, Take 5,000 Units by mouth daily., Disp: , Rfl:  .  cyanocobalamin 500 MCG tablet, Take 500 mcg by mouth daily., Disp: , Rfl:  .  IRON-B12-VIT C-FA-IFC PO, Take 29 mg by mouth daily., Disp: , Rfl:  .  Multiple Vitamin (MULTIVITAMIN) tablet, Take 1 tablet by mouth daily., Disp: , Rfl:  .  vitamin A 10000 UNIT capsule, Take 10,000 Units by mouth daily., Disp: , Rfl:  .  VITAMIN E PO, Take 300 Units by mouth daily., Disp: , Rfl:  .  vortioxetine HBr (TRINTELLIX) 10 MG TABS tablet, Take 1 tablet (10 mg total) by mouth daily., Disp: 90 tablet, Rfl: 1   Allergies  Allergen Reactions  . Bee Venom Anaphylaxis     Review of Systems  Constitutional:  Negative for chills and fatigue.  Respiratory: Negative.   Cardiovascular: Negative.   Genitourinary: Positive for dysuria.  Musculoskeletal: Positive for back pain (left low flank pain).     Today's Vitals   11/21/18 1601  BP: 110/60  Pulse: 60  Temp: 97.9 F (36.6 C)  TempSrc: Oral  SpO2: 98%  Weight: 174 lb 9.6 oz (79.2 kg)  Height: 5\' 7"  (1.702 m)  PainSc: 0-No pain   Body mass index is 27.35 kg/m.   Objective:  Physical Exam Constitutional:      Appearance: Normal appearance.  Cardiovascular:     Rate and Rhythm: Normal rate and regular rhythm.     Pulses: Normal pulses.     Heart sounds: Normal heart sounds. No murmur.  Pulmonary:     Effort: Pulmonary effort is normal.     Breath sounds: Normal breath sounds.  Abdominal:     General: Abdomen is flat. Bowel sounds are normal. There is no distension.     Palpations: Abdomen is soft.  Neurological:     General: No focal deficit present.     Mental Status: She is alert.  Psychiatric:        Mood and Affect: Mood normal.         Assessment And Plan:     1.  Urinary frequency  Large leukocytes, will send UA for culture  Encouraged to drink water avoid sodas and tea - POCT Urinalysis Dipstick (81002) - Culture, Urine - ciprofloxacin (CIPRO) 500 MG tablet; Take 1 tablet (500 mg total) by mouth 2 (two) times daily for 3 days.  Dispense: 10 tablet; Refill: 0      Minette Brine, FNP

## 2018-11-21 NOTE — Patient Instructions (Signed)
Urinary Tract Infection, Adult A urinary tract infection (UTI) is an infection of any part of the urinary tract. The urinary tract includes:  The kidneys.  The ureters.  The bladder.  The urethra. These organs make, store, and get rid of pee (urine) in the body. What are the causes? This is caused by germs (bacteria) in your genital area. These germs grow and cause swelling (inflammation) of your urinary tract. What increases the risk? You are more likely to develop this condition if:  You have a small, thin tube (catheter) to drain pee.  You cannot control when you pee or poop (incontinence).  You are female, and: ? You use these methods to prevent pregnancy: ? A medicine that kills sperm (spermicide). ? A device that blocks sperm (diaphragm). ? You have low levels of a female hormone (estrogen). ? You are pregnant.  You have genes that add to your risk.  You are sexually active.  You take antibiotic medicines.  You have trouble peeing because of: ? A prostate that is bigger than normal, if you are female. ? A blockage in the part of your body that drains pee from the bladder (urethra). ? A kidney stone. ? A nerve condition that affects your bladder (neurogenic bladder). ? Not getting enough to drink. ? Not peeing often enough.  You have other conditions, such as: ? Diabetes. ? A weak disease-fighting system (immune system). ? Sickle cell disease. ? Gout. ? Injury of the spine. What are the signs or symptoms? Symptoms of this condition include:  Needing to pee right away (urgently).  Peeing often.  Peeing small amounts often.  Pain or burning when peeing.  Blood in the pee.  Pee that smells bad or not like normal.  Trouble peeing.  Pee that is cloudy.  Fluid coming from the vagina, if you are female.  Pain in the belly or lower back. Other symptoms include:  Throwing up (vomiting).  No urge to eat.  Feeling mixed up (confused).  Being tired  and grouchy (irritable).  A fever.  Watery poop (diarrhea). How is this treated? This condition may be treated with:  Antibiotic medicine.  Other medicines.  Drinking enough water. Follow these instructions at home:  Medicines  Take over-the-counter and prescription medicines only as told by your doctor.  If you were prescribed an antibiotic medicine, take it as told by your doctor. Do not stop taking it even if you start to feel better. General instructions  Make sure you: ? Pee until your bladder is empty. ? Do not hold pee for a long time. ? Empty your bladder after sex. ? Wipe from front to back after pooping if you are a female. Use each tissue one time when you wipe.  Drink enough fluid to keep your pee pale yellow.  Keep all follow-up visits as told by your doctor. This is important. Contact a doctor if:  You do not get better after 1-2 days.  Your symptoms go away and then come back. Get help right away if:  You have very bad back pain.  You have very bad pain in your lower belly.  You have a fever.  You are sick to your stomach (nauseous).  You are throwing up. Summary  A urinary tract infection (UTI) is an infection of any part of the urinary tract.  This condition is caused by germs in your genital area.  There are many risk factors for a UTI. These include having a small, thin   tube to drain pee and not being able to control when you pee or poop.  Treatment includes antibiotic medicines for germs.  Drink enough fluid to keep your pee pale yellow. This information is not intended to replace advice given to you by your health care provider. Make sure you discuss any questions you have with your health care provider. Document Released: 04/16/2008 Document Revised: 05/08/2018 Document Reviewed: 05/08/2018 Elsevier Interactive Patient Education  2019 Elsevier Inc.  

## 2018-11-23 LAB — URINE CULTURE

## 2018-12-07 ENCOUNTER — Encounter: Payer: Self-pay | Admitting: Nurse Practitioner

## 2019-01-09 ENCOUNTER — Ambulatory Visit: Payer: BC Managed Care – PPO | Admitting: Nurse Practitioner

## 2019-01-09 ENCOUNTER — Encounter: Payer: Self-pay | Admitting: Nurse Practitioner

## 2019-01-09 VITALS — BP 118/82 | HR 50 | Temp 97.9°F | Ht 66.8 in | Wt 175.6 lb

## 2019-01-09 DIAGNOSIS — F419 Anxiety disorder, unspecified: Secondary | ICD-10-CM | POA: Insufficient documentation

## 2019-01-09 DIAGNOSIS — G4700 Insomnia, unspecified: Secondary | ICD-10-CM | POA: Diagnosis not present

## 2019-01-09 MED ORDER — TRAZODONE HCL 50 MG PO TABS: 50.0000 mg | ORAL_TABLET | Freq: Every day | ORAL | 1 refills | Status: DC

## 2019-01-09 NOTE — Progress Notes (Addendum)
Subjective:     Patient ID: Valerie Stuart , female    DOB: 01/31/1956 , 63 y.o.   MRN: [id number]   Chief Complaint  Patient presents with  . other    follow up on anxiety , has improved still having issues sleeping    HPI  Anxiety  Presents for follow-up visit. Symptoms include insomnia. Patient reports no chest pain, decreased concentration or palpitations. Primary symptoms comment: awakens at 2 or 3 am if does not take Benadryl.  . The severity of symptoms is moderate.    Insomnia  Primary symptoms: fragmented sleep.  The current episode started more than one year. The onset quality is gradual. The problem occurs nightly. The problem has been gradually worsening since onset. The symptoms are aggravated by anxiety. Treatments tried: benadryl. Typical bedtime:  11-12 P.M..  PMH includes: associated symptoms present, no hypertension.     Past Medical History:  Diagnosis Date  . Hyperlipidemia      Family History  Problem Relation Age of Onset  . Heart disease Mother   . Hypertension Mother   . Alzheimer's disease Father   . Hypertension Brother      Current Outpatient Medications:  .  calcium citrate (CALCITRATE - DOSED IN MG ELEMENTAL CALCIUM) 950 MG tablet, Take 200 mg of elemental calcium by mouth 4 (four) times daily., Disp: , Rfl:  .  calcium-vitamin D (OSCAL WITH D) 500-200 MG-UNIT tablet, Take 1 tablet by mouth daily with breakfast., Disp: , Rfl:  .  Cholecalciferol (D-3-5) 5000 units capsule, Take 5,000 Units by mouth daily., Disp: , Rfl:  .  cyanocobalamin 500 MCG tablet, Take 500 mcg by mouth daily., Disp: , Rfl:  .  IRON-B12-VIT C-FA-IFC PO, Take 29 mg by mouth daily., Disp: , Rfl:  .  Multiple Vitamin (MULTIVITAMIN) tablet, Take 1 tablet by mouth daily., Disp: , Rfl:  .  vitamin A 10000 UNIT capsule, Take 10,000 Units by mouth daily., Disp: , Rfl:  .  VITAMIN E PO, Take 300 Units by mouth daily., Disp: , Rfl:  .  vortioxetine HBr (TRINTELLIX) 10 MG TABS  tablet, Take 1 tablet (10 mg total) by mouth daily., Disp: 90 tablet, Rfl: 1   Allergies  Allergen Reactions  . Bee Venom Anaphylaxis     Review of Systems  Constitutional: Negative for fatigue.  Respiratory: Negative for cough.   Cardiovascular: Negative for chest pain, palpitations and leg swelling.  Endocrine: Negative for polydipsia, polyphagia and polyuria.  Psychiatric/Behavioral: Negative for decreased concentration. The patient has insomnia.      Today's Vitals   01/09/19 0845  BP: 118/82  Pulse: (!) 50  Temp: 97.9 F (36.6 C)  SpO2: 99%  Weight: 175 lb 9.6 oz (79.7 kg)  Height: 5' 6.8" (1.697 m)   Body mass index is 27.67 kg/m.   Objective:  Physical Exam Vitals signs reviewed.  Constitutional:      General: She is not in acute distress.    Appearance: Normal appearance. She is well-developed.  Eyes:     Pupils: Pupils are equal, round, and reactive to light.  Cardiovascular:     Rate and Rhythm: Normal rate and regular rhythm.     Pulses: Normal pulses.     Heart sounds: Normal heart sounds. No murmur.  Pulmonary:     Effort: Pulmonary effort is normal.     Breath sounds: Normal breath sounds.  Musculoskeletal: Normal range of motion.  Skin:    General: Skin is warm and  dry.     Capillary Refill: Capillary refill takes less than 2 seconds.  Neurological:     General: No focal deficit present.     Mental Status: She is alert and oriented to person, place, and time.     Cranial Nerves: No cranial nerve deficit.  Psychiatric:        Mood and Affect: Mood normal.         Assessment And Plan:     1. Insomnia, unspecified type  Chronic,   Taking benadryl nightly   We will try trazodone 1/2 tablet may take second half as needed if not sleep in an hour after first dose.   Return in 8 weeks for medication follow up - traZODone (DESYREL) 50 MG tablet; Take 1 tablet (50 mg total) by mouth at bedtime.  Dispense: 30 tablet; Refill: 1 - CMP14 + Anion  Gap  2. Anxiety  Chronic, she is doing well, PHQ is 0   Continue with current medications (Trintellix 10 mg daily) - CMP14 + Anion Gap    Minette Brine, FNP

## 2019-01-09 NOTE — Patient Instructions (Signed)

## 2019-01-10 LAB — CMP14 + ANION GAP
A/G RATIO: 1.2 (ref 1.2–2.2)
ALT: 12 IU/L (ref 0–32)
AST: 19 IU/L (ref 0–40)
Albumin: 3.8 g/dL (ref 3.8–4.9)
Alkaline Phosphatase: 112 IU/L (ref 39–117)
Anion Gap: 13 mmol/L (ref 10.0–18.0)
BILIRUBIN TOTAL: 0.2 mg/dL (ref 0.0–1.2)
BUN / CREAT RATIO: 15 (ref 12–28)
BUN: 14 mg/dL (ref 8–27)
CALCIUM: 9 mg/dL (ref 8.7–10.3)
CHLORIDE: 107 mmol/L — AB (ref 96–106)
CO2: 25 mmol/L (ref 20–29)
Creatinine, Ser: 0.91 mg/dL (ref 0.57–1.00)
GFR, EST AFRICAN AMERICAN: 79 mL/min/{1.73_m2} (ref >59)
GFR, EST NON AFRICAN AMERICAN: 69 mL/min/{1.73_m2} (ref >59)
GLOBULIN, TOTAL: 3.1 g/dL (ref 1.5–4.5)
GLUCOSE: 69 mg/dL (ref 65–99)
POTASSIUM: 4.6 mmol/L (ref 3.5–5.2)
SODIUM: 145 mmol/L — AB (ref 134–144)
TOTAL PROTEIN: 6.9 g/dL (ref 6.0–8.5)

## 2019-03-03 ENCOUNTER — Ambulatory Visit: Payer: BC Managed Care – PPO | Admitting: Nurse Practitioner

## 2019-03-03 ENCOUNTER — Encounter: Payer: Self-pay | Admitting: Nurse Practitioner

## 2019-03-03 ENCOUNTER — Other Ambulatory Visit: Payer: Self-pay

## 2019-03-03 DIAGNOSIS — F329 Major depressive disorder, single episode, unspecified: Secondary | ICD-10-CM | POA: Diagnosis not present

## 2019-03-03 DIAGNOSIS — F32A Depression, unspecified: Secondary | ICD-10-CM | POA: Insufficient documentation

## 2019-03-03 DIAGNOSIS — G4700 Insomnia, unspecified: Secondary | ICD-10-CM | POA: Diagnosis not present

## 2019-03-03 MED ORDER — TRAZODONE HCL 50 MG PO TABS: 50.0000 mg | ORAL_TABLET | Freq: Every day | ORAL | 4 refills | Status: DC

## 2019-03-03 MED ORDER — VORTIOXETINE HBR 10 MG PO TABS: 10.0000 mg | ORAL_TABLET | Freq: Every day | ORAL | 1 refills | Status: DC
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
## 2019-03-06 ENCOUNTER — Ambulatory Visit: Payer: BC Managed Care – PPO | Admitting: Nurse Practitioner

## 2019-04-24 ENCOUNTER — Encounter: Payer: Self-pay | Admitting: Nurse Practitioner

## 2019-04-28 ENCOUNTER — Other Ambulatory Visit: Payer: Self-pay

## 2019-04-28 ENCOUNTER — Encounter: Payer: Self-pay | Admitting: Nurse Practitioner

## 2019-04-28 ENCOUNTER — Ambulatory Visit: Payer: BC Managed Care – PPO | Admitting: Nurse Practitioner

## 2019-04-28 VITALS — BP 102/62 | HR 54 | Temp 98.4°F | Ht 67.4 in | Wt 172.4 lb

## 2019-04-28 DIAGNOSIS — E782 Mixed hyperlipidemia: Secondary | ICD-10-CM | POA: Diagnosis not present

## 2019-04-28 DIAGNOSIS — Z0000 Encounter for general adult medical examination without abnormal findings: Secondary | ICD-10-CM

## 2019-04-28 DIAGNOSIS — R001 Bradycardia, unspecified: Secondary | ICD-10-CM | POA: Diagnosis not present

## 2019-04-28 DIAGNOSIS — R7309 Other abnormal glucose: Secondary | ICD-10-CM

## 2019-04-28 DIAGNOSIS — Z23 Encounter for immunization: Secondary | ICD-10-CM

## 2019-04-28 DIAGNOSIS — F32A Depression, unspecified: Secondary | ICD-10-CM

## 2019-04-28 DIAGNOSIS — F329 Major depressive disorder, single episode, unspecified: Secondary | ICD-10-CM

## 2019-04-28 LAB — POCT URINALYSIS DIPSTICK
Bilirubin, UA: NEGATIVE
Glucose, UA: NEGATIVE
Ketones, UA: NEGATIVE
Nitrite, UA: NEGATIVE
Protein, UA: NEGATIVE
Spec Grav, UA: 1.025 (ref 1.010–1.025)
Urobilinogen, UA: 0.2 E.U./dL
pH, UA: 5.5 (ref 5.0–8.0)

## 2019-04-28 MED ORDER — VORTIOXETINE HBR 10 MG PO TABS: 10.0000 mg | ORAL_TABLET | Freq: Every day | ORAL | 1 refills | Status: DC

## 2019-04-28 NOTE — Progress Notes (Signed)
Subjective:     Patient ID: Valerie Stuart , female    DOB: [birth year] , 63 y.o.   MRN: [id number]   Chief Complaint  Patient presents with  . Annual Exam   The patient states she uses post menopausal status for birth control. Last LMP was No LMP recorded. Patient is postmenopausal.. Negative for Dysmenorrhea and Negative for Menorrhagia Mammogram last done 07/08/2018.  Negative for: breast discharge, breast lump(s), breast pain and breast self exam.  Pertinent negatives include abnormal bleeding (hematology), anxiety, decreased libido, depression, difficulty falling sleep, dyspareunia, history of infertility, nocturia, sexual dysfunction, sleep disturbances, urinary incontinence, urinary urgency, vaginal discharge and vaginal itching. Diet regular.The patient states her exercise level is  intermittently    The patient's tobacco use is:  Social History   Tobacco Use  Smoking Status Never Smoker  Smokeless Tobacco Never Used   She has been exposed to passive smoke. The patient's alcohol use is:  Social History   Substance and Sexual Activity  Alcohol Use No   Additional information: Last pap 04/18/2018, next one scheduled for  HPI  Depression        This is a chronic problem.  The current episode started more than 1 year ago.   The onset quality is gradual.   The problem occurs intermittently.  Associated symptoms include no decreased concentration, no fatigue and no headaches.  Past treatments include SSRIs - Selective serotonin reuptake inhibitors.  Compliance with treatment is good.    Past Medical History:  Diagnosis Date  . Hyperlipidemia      Family History  Problem Relation Age of Onset  . Heart disease Mother   . Hypertension Mother   . Alzheimer's disease Father   . Hypertension Brother      Current Outpatient Medications:  .  calcium citrate (CALCITRATE - DOSED IN MG ELEMENTAL CALCIUM) 950 MG tablet, Take 200 mg of elemental calcium by mouth 4 (four) times  daily., Disp: , Rfl:  .  calcium-vitamin D (OSCAL WITH D) 500-200 MG-UNIT tablet, Take 1 tablet by mouth daily with breakfast., Disp: , Rfl:  .  Cholecalciferol (D-3-5) 5000 units capsule, Take 5,000 Units by mouth daily., Disp: , Rfl:  .  cyanocobalamin 500 MCG tablet, Take 500 mcg by mouth daily., Disp: , Rfl:  .  IRON-B12-VIT C-FA-IFC PO, Take 29 mg by mouth daily., Disp: , Rfl:  .  Multiple Vitamin (MULTIVITAMIN) tablet, Take 1 tablet by mouth daily., Disp: , Rfl:  .  traZODone (DESYREL) 50 MG tablet, Take 1 tablet (50 mg total) by mouth at bedtime., Disp: 30 tablet, Rfl: 4 .  vitamin A 10000 UNIT capsule, Take 10,000 Units by mouth daily., Disp: , Rfl:  .  VITAMIN E PO, Take 300 Units by mouth daily., Disp: , Rfl:  .  vortioxetine HBr (TRINTELLIX) 10 MG TABS tablet, Take 1 tablet (10 mg total) by mouth daily., Disp: 90 tablet, Rfl: 1   Allergies  Allergen Reactions  . Bee Venom Anaphylaxis     Review of Systems  Constitutional: Negative.  Negative for fatigue.  HENT: Negative.   Eyes: Negative.   Respiratory: Negative.   Cardiovascular: Negative.   Gastrointestinal: Negative.   Endocrine: Negative.   Genitourinary: Negative.   Musculoskeletal: Negative.   Skin: Negative.   Allergic/Immunologic: Negative.   Neurological: Negative.  Negative for dizziness and headaches.  Hematological: Negative.   Psychiatric/Behavioral: Positive for depression. Negative for decreased concentration.     Today's Vitals   04/28/19 0910  BP: 102/62  Pulse: (!) 54  Temp: 98.4 F (36.9 C)  TempSrc: Oral  Weight: 172 lb 6.4 oz (78.2 kg)  Height: 5' 7.4" (1.712 m)  PainSc: 0-No pain   Body mass index is 26.68 kg/m.   Objective:  Physical Exam Constitutional:      General: She is not in acute distress.    Appearance: Normal appearance. She is well-developed.  HENT:     Head: Normocephalic and atraumatic.     Right Ear: Hearing, tympanic membrane, ear canal and external ear normal.      Left Ear: Hearing, tympanic membrane, ear canal and external ear normal.  Eyes:     General: Lids are normal.     Extraocular Movements: Extraocular movements intact.     Conjunctiva/sclera: Conjunctivae normal.     Pupils: Pupils are equal, round, and reactive to light.     Funduscopic exam:    Right eye: No papilledema.        Left eye: No papilledema.  Neck:     Musculoskeletal: Full passive range of motion without pain, normal range of motion and neck supple.     Thyroid: No thyroid mass.     Vascular: No carotid bruit.  Cardiovascular:     Rate and Rhythm: Normal rate and regular rhythm.     Pulses: Normal pulses.     Heart sounds: Normal heart sounds. No murmur.  Pulmonary:     Effort: Pulmonary effort is normal.     Breath sounds: Normal breath sounds.  Abdominal:     General: Abdomen is flat. Bowel sounds are normal.     Palpations: Abdomen is soft.  Musculoskeletal: Normal range of motion.        General: No swelling.     Right lower leg: No edema.     Left lower leg: No edema.  Skin:    General: Skin is warm and dry.     Capillary Refill: Capillary refill takes less than 2 seconds.  Neurological:     General: No focal deficit present.     Mental Status: She is alert and oriented to person, place, and time.     Cranial Nerves: No cranial nerve deficit.     Sensory: No sensory deficit.  Psychiatric:        Mood and Affect: Mood normal.        Behavior: Behavior normal.        Thought Content: Thought content normal.        Judgment: Judgment normal.         Assessment And Plan:     1. Encounter for general adult medical examination w/o abnormal findings . Behavior modifications discussed and diet history reviewed.   . Pt will continue to exercise regularly and modify diet with low GI, plant based foods and decrease intake of processed foods.  . Recommend intake of daily multivitamin, Vitamin D, and calcium.  . Recommend for preventive screenings, as well as  recommend immunizations that include influenza, TDAP - POCT Urinalysis Dipstick (81002) - HIV antibody (with reflex) - CBC no Diff - CMP14 + Anion Gap; Future - CMP14 + Anion Gap  2. Bradycardia  Chronic, she has had a full cardiac work up and is due for a yearly visit with cardiology  No acute changes to her EKG - EKG 12-Lead  3. Mixed hyperlipidemia  Chronic, controlled  No current medications - Lipid Profile  4. Abnormal glucose  Chronic, controlled  She is well controlled  with any medications  Encouraged to limit intake of sugary foods and drinks  Encouraged to increase physical activity to 150 minutes per week - Hemoglobin A1c  5. Depression, unspecified depression type  Chronic  She is doing well on trintellix - vortioxetine HBr (TRINTELLIX) 10 MG TABS tablet; Take 1 tablet (10 mg total) by mouth daily.  Dispense: 90 tablet; Refill: 1  6. Encounter for immunization  Administered in office.  - Pneumococcal polysaccharide vaccine 23-valent greater than or equal to 2yo subcutaneous/IM       Minette Brine, FNP    THE PATIENT IS ENCOURAGED TO PRACTICE SOCIAL DISTANCING DUE TO THE COVID-19 PANDEMIC.

## 2019-04-28 NOTE — Patient Instructions (Addendum)
Health Maintenance  Topic Date Due  . OPHTHALMOLOGY EXAM  10/30/1968  . HIV Screening  10/30/1973  . INFLUENZA VACCINE  06/13/2019  . HEMOGLOBIN A1C  10/28/2019  . FOOT EXAM  04/27/2020  . URINE MICROALBUMIN  04/27/2020  . MAMMOGRAM  07/08/2020  . PAP SMEAR-Modifier  04/18/2021  . TETANUS/TDAP  12/02/2023  . COLONOSCOPY  05/21/2027  . PNEUMOCOCCAL POLYSACCHARIDE VACCINE AGE 61-64 HIGH RISK  Completed  . Hepatitis C Screening  Completed    Health Maintenance, Female Adopting a healthy lifestyle and getting preventive care can go a long way to promote health and wellness. Talk with your health care provider about what schedule of regular examinations is right for you. This is a good chance for you to check in with your provider about disease prevention and staying healthy. In between checkups, there are plenty of things you can do on your own. Experts have done a lot of research about which lifestyle changes and preventive measures are most likely to keep you healthy. Ask your health care provider for more information. Weight and diet Eat a healthy diet  Be sure to include plenty of vegetables, fruits, low-fat dairy products, and lean protein.  Do not eat a lot of foods high in solid fats, added sugars, or salt.  Get regular exercise. This is one of the most important things you can do for your health. ? Most adults should exercise for at least 150 minutes each week. The exercise should increase your heart rate and make you sweat (moderate-intensity exercise). ? Most adults should also do strengthening exercises at least twice a week. This is in addition to the moderate-intensity exercise. Maintain a healthy weight  Body mass index (BMI) is a measurement that can be used to identify possible weight problems. It estimates body fat based on height and weight. Your health care provider can help determine your BMI and help you achieve or maintain a healthy weight.  For females 54 years of  age and older: ? A BMI below 18.5 is considered underweight. ? A BMI of 18.5 to 24.9 is normal. ? A BMI of 25 to 29.9 is considered overweight. ? A BMI of 30 and above is considered obese. Watch levels of cholesterol and blood lipids  You should start having your blood tested for lipids and cholesterol at 61 years of age, then have this test every 5 years.  You may need to have your cholesterol levels checked more often if: ? Your lipid or cholesterol levels are high. ? You are older than 61 years of age. ? You are at high risk for heart disease. Cancer screening Lung Cancer  Lung cancer screening is recommended for adults 70-44 years old who are at high risk for lung cancer because of a history of smoking.  A yearly low-dose CT scan of the lungs is recommended for people who: ? Currently smoke. ? Have quit within the past 15 years. ? Have at least a 30-pack-year history of smoking. A pack year is smoking an average of one pack of cigarettes a day for 1 year.  Yearly screening should continue until it has been 15 years since you quit.  Yearly screening should stop if you develop a health problem that would prevent you from having lung cancer treatment. Breast Cancer  Practice breast self-awareness. This means understanding how your breasts normally appear and feel.  It also means doing regular breast self-exams. Let your health care provider know about any changes, no matter how  small.  If you are in your 20s or 30s, you should have a clinical breast exam (CBE) by a health care provider every 1-3 years as part of a regular health exam.  If you are 40 or older, have a CBE every year. Also consider having a breast X-ray (mammogram) every year.  If you have a family history of breast cancer, talk to your health care provider about genetic screening.  If you are at high risk for breast cancer, talk to your health care provider about having an MRI and a mammogram every  year.  Breast cancer gene (BRCA) assessment is recommended for women who have family members with BRCA-related cancers. BRCA-related cancers include: ? Breast. ? Ovarian. ? Tubal. ? Peritoneal cancers.  Results of the assessment will determine the need for genetic counseling and BRCA1 and BRCA2 testing. Cervical Cancer Your health care provider may recommend that you be screened regularly for cancer of the pelvic organs (ovaries, uterus, and vagina). This screening involves a pelvic examination, including checking for microscopic changes to the surface of your cervix (Pap test). You may be encouraged to have this screening done every 3 years, beginning at age 41.  For women ages 11-65, health care providers may recommend pelvic exams and Pap testing every 3 years, or they may recommend the Pap and pelvic exam, combined with testing for human papilloma virus (HPV), every 5 years. Some types of HPV increase your risk of cervical cancer. Testing for HPV may also be done on women of any age with unclear Pap test results.  Other health care providers may not recommend any screening for nonpregnant women who are considered low risk for pelvic cancer and who do not have symptoms. Ask your health care provider if a screening pelvic exam is right for you.  If you have had past treatment for cervical cancer or a condition that could lead to cancer, you need Pap tests and screening for cancer for at least 20 years after your treatment. If Pap tests have been discontinued, your risk factors (such as having a new sexual partner) need to be reassessed to determine if screening should resume. Some women have medical problems that increase the chance of getting cervical cancer. In these cases, your health care provider may recommend more frequent screening and Pap tests. Colorectal Cancer  This type of cancer can be detected and often prevented.  Routine colorectal cancer screening usually begins at 61 years of  age and continues through 61 years of age.  Your health care provider may recommend screening at an earlier age if you have risk factors for colon cancer.  Your health care provider may also recommend using home test kits to check for hidden blood in the stool.  A small camera at the end of a tube can be used to examine your colon directly (sigmoidoscopy or colonoscopy). This is done to check for the earliest forms of colorectal cancer.  Routine screening usually begins at age 46.  Direct examination of the colon should be repeated every 5-10 years through 61 years of age. However, you may need to be screened more often if early forms of precancerous polyps or small growths are found. Skin Cancer  Check your skin from head to toe regularly.  Tell your health care provider about any new moles or changes in moles, especially if there is a change in a mole's shape or color.  Also tell your health care provider if you have a mole that is larger  than the size of a pencil eraser.  Always use sunscreen. Apply sunscreen liberally and repeatedly throughout the day.  Protect yourself by wearing long sleeves, pants, a wide-brimmed hat, and sunglasses whenever you are outside. Heart disease, diabetes, and high blood pressure  High blood pressure causes heart disease and increases the risk of stroke. High blood pressure is more likely to develop in: ? People who have blood pressure in the high end of the normal range (130-139/85-89 mm Hg). ? People who are overweight or obese. ? People who are African American.  If you are 46-4 years of age, have your blood pressure checked every 3-5 years. If you are 56 years of age or older, have your blood pressure checked every year. You should have your blood pressure measured twice-once when you are at a hospital or clinic, and once when you are not at a hospital or clinic. Record the average of the two measurements. To check your blood pressure when you are  not at a hospital or clinic, you can use: ? An automated blood pressure machine at a pharmacy. ? A home blood pressure monitor.  If you are between 68 years and 3 years old, ask your health care provider if you should take aspirin to prevent strokes.  Have regular diabetes screenings. This involves taking a blood sample to check your fasting blood sugar level. ? If you are at a normal weight and have a low risk for diabetes, have this test once every three years after 61 years of age. ? If you are overweight and have a high risk for diabetes, consider being tested at a younger age or more often. Preventing infection Hepatitis B  If you have a higher risk for hepatitis B, you should be screened for this virus. You are considered at high risk for hepatitis B if: ? You were born in a country where hepatitis B is common. Ask your health care provider which countries are considered high risk. ? Your parents were born in a high-risk country, and you have not been immunized against hepatitis B (hepatitis B vaccine). ? You have HIV or AIDS. ? You use needles to inject street drugs. ? You live with someone who has hepatitis B. ? You have had sex with someone who has hepatitis B. ? You get hemodialysis treatment. ? You take certain medicines for conditions, including cancer, organ transplantation, and autoimmune conditions. Hepatitis C  Blood testing is recommended for: ? Everyone born from [birth year] through 1963. ? Anyone with known risk factors for hepatitis C. Sexually transmitted infections (STIs)  You should be screened for sexually transmitted infections (STIs) including gonorrhea and chlamydia if: ? You are sexually active and are younger than 61 years of age. ? You are older than 61 years of age and your health care provider tells you that you are at risk for this type of infection. ? Your sexual activity has changed since you were last screened and you are at an increased risk for chlamydia  or gonorrhea. Ask your health care provider if you are at risk.  If you do not have HIV, but are at risk, it may be recommended that you take a prescription medicine daily to prevent HIV infection. This is called pre-exposure prophylaxis (PrEP). You are considered at risk if: ? You are sexually active and do not regularly use condoms or know the HIV status of your partner(s). ? You take drugs by injection. ? You are sexually active with a partner who  has HIV. Talk with your health care provider about whether you are at high risk of being infected with HIV. If you choose to begin PrEP, you should first be tested for HIV. You should then be tested every 3 months for as long as you are taking PrEP. Pregnancy  If you are premenopausal and you may become pregnant, ask your health care provider about preconception counseling.  If you may become pregnant, take 400 to 800 micrograms (mcg) of folic acid every day.  If you want to prevent pregnancy, talk to your health care provider about birth control (contraception). Osteoporosis and menopause  Osteoporosis is a disease in which the bones lose minerals and strength with aging. This can result in serious bone fractures. Your risk for osteoporosis can be identified using a bone density scan.  If you are 65 years of age or older, or if you are at risk for osteoporosis and fractures, ask your health care provider if you should be screened.  Ask your health care provider whether you should take a calcium or vitamin D supplement to lower your risk for osteoporosis.  Menopause may have certain physical symptoms and risks.  Hormone replacement therapy may reduce some of these symptoms and risks. Talk to your health care provider about whether hormone replacement therapy is right for you. Follow these instructions at home:  Schedule regular health, dental, and eye exams.  Stay current with your immunizations.  Do not use any tobacco products including  cigarettes, chewing tobacco, or electronic cigarettes.  If you are pregnant, do not drink alcohol.  If you are breastfeeding, limit how much and how often you drink alcohol.  Limit alcohol intake to no more than 1 drink per day for nonpregnant women. One drink equals 12 ounces of beer, 5 ounces of wine, or 1 ounces of hard liquor.  Do not use street drugs.  Do not share needles.  Ask your health care provider for help if you need support or information about quitting drugs.  Tell your health care provider if you often feel depressed.  Tell your health care provider if you have ever been abused or do not feel safe at home. This information is not intended to replace advice given to you by your health care provider. Make sure you discuss any questions you have with your health care provider. Document Released: 05/14/2011 Document Revised: 04/05/2016 Document Reviewed: 08/02/2015 Elsevier Interactive Patient Education  2019 [employer].

## 2019-04-29 ENCOUNTER — Telehealth: Payer: Self-pay | Admitting: Cardiovascular Disease

## 2019-04-29 LAB — CBC
Hematocrit: 36.1 % (ref 34.0–46.6)
Hemoglobin: 11.4 g/dL (ref 11.1–15.9)
MCH: 26.6 pg (ref 26.6–33.0)
MCHC: 31.6 g/dL (ref 31.5–35.7)
MCV: 84 fL (ref 79–97)
Platelets: 291 10*3/uL (ref 150–450)
RBC: 4.29 x10E6/uL (ref 3.77–5.28)
RDW: 13.3 % (ref 11.7–15.4)
WBC: 6 10*3/uL (ref 3.4–10.8)

## 2019-04-29 LAB — CMP14 + ANION GAP
ALT: 8 IU/L (ref 0–32)
AST: 19 IU/L (ref 0–40)
Albumin/Globulin Ratio: 1.3 (ref 1.2–2.2)
Albumin: 4.1 g/dL (ref 3.8–4.9)
Alkaline Phosphatase: 116 IU/L (ref 39–117)
Anion Gap: 11 mmol/L (ref 10.0–18.0)
BUN/Creatinine Ratio: 16 (ref 12–28)
BUN: 14 mg/dL (ref 8–27)
Bilirubin Total: 0.4 mg/dL (ref 0.0–1.2)
CO2: 24 mmol/L (ref 20–29)
Calcium: 9.1 mg/dL (ref 8.7–10.3)
Chloride: 107 mmol/L — ABNORMAL HIGH (ref 96–106)
Creatinine, Ser: 0.89 mg/dL (ref 0.57–1.00)
GFR calc Af Amer: 81 mL/min/{1.73_m2} (ref >59)
GFR calc non Af Amer: 71 mL/min/{1.73_m2} (ref >59)
Globulin, Total: 3.2 g/dL (ref 1.5–4.5)
Glucose: 75 mg/dL (ref 65–99)
Potassium: 3.4 mmol/L — ABNORMAL LOW (ref 3.5–5.2)
Sodium: 142 mmol/L (ref 134–144)
Total Protein: 7.3 g/dL (ref 6.0–8.5)

## 2019-04-29 LAB — LIPID PANEL
Chol/HDL Ratio: 2.3 ratio (ref 0.0–4.4)
Cholesterol, Total: 161 mg/dL (ref 100–199)
HDL: 71 mg/dL (ref >39)
LDL Calculated: 79 mg/dL (ref 0–99)
Triglycerides: 57 mg/dL (ref 0–149)
VLDL Cholesterol Cal: 11 mg/dL (ref 5–40)

## 2019-04-29 LAB — HEMOGLOBIN A1C
Est. average glucose Bld gHb Est-mCnc: 97 mg/dL
Hgb A1c MFr Bld: 5 % (ref 4.8–5.6)

## 2019-04-29 LAB — HIV ANTIBODY (ROUTINE TESTING W REFLEX): HIV Screen 4th Generation wRfx: NONREACTIVE

## 2019-04-29 NOTE — Telephone Encounter (Signed)
New Message:     Pt saw her primary doctor yesterday. Her pulse rate was 47. Primary doctor wants her to follow up wit her Cardiologist. Can pt see a PA this week or next week?

## 2019-04-29 NOTE — Telephone Encounter (Signed)
Pt was seen by J. Laurance Flatten FNP yesterday, note available. Pt has been seen by cardiology for chronic low pulse rate. Yesterday bp was 102/62 p 54.  Pt c/o chronic fatigue since weight loss surgery and low heart rate. Fit bit shows her HR to be 47-61. Denies dizziness, SOB, no syncopal episodes in her hx, no bp meds.  Pt wonders if she needs to be followed up by cardiology at this time or not. Wonders if her heart rate can be the cause of her fatigue? Pt was told that a message would be sent to DR/RN to advise if appointment is needed. Pt verbalized understanding that staff will call her back with advice.

## 2019-05-05 NOTE — Telephone Encounter (Signed)
She has had a long H/O brady with event monitors. I have referred her to Dr Sallyanne Kuster to discuss PTVPM and he didn't feel that she would benefit. No reason for F/U of the same problem

## 2019-05-12 NOTE — Telephone Encounter (Signed)
Spoke with pt, aware of dr berry's recommendations. She will call if anything changes.

## 2019-05-19 ENCOUNTER — Other Ambulatory Visit: Payer: Self-pay | Admitting: *Deleted

## 2019-05-19 DIAGNOSIS — Z20822 Contact with and (suspected) exposure to covid-19: Secondary | ICD-10-CM

## 2019-05-24 LAB — NOVEL CORONAVIRUS, NAA: SARS-CoV-2, NAA: NOT DETECTED

## 2019-05-25 ENCOUNTER — Other Ambulatory Visit: Payer: Self-pay | Admitting: Internal Medicine

## 2019-05-25 DIAGNOSIS — Z1231 Encounter for screening mammogram for malignant neoplasm of breast: Secondary | ICD-10-CM

## 2019-07-10 ENCOUNTER — Other Ambulatory Visit: Payer: Self-pay

## 2019-07-10 ENCOUNTER — Ambulatory Visit: Payer: BC Managed Care – PPO

## 2019-07-10 ENCOUNTER — Ambulatory Visit
Admission: RE | Admit: 2019-07-10 | Discharge: 2019-07-10 | Disposition: A | Discharge status: Home / Self Care | Payer: BC Managed Care – PPO | Source: Ambulatory Visit | Attending: Internal Medicine | Admitting: Internal Medicine

## 2019-07-10 DIAGNOSIS — Z1231 Encounter for screening mammogram for malignant neoplasm of breast: Secondary | ICD-10-CM

## 2019-08-21 ENCOUNTER — Other Ambulatory Visit: Payer: Self-pay

## 2019-08-21 DIAGNOSIS — Z20822 Contact with and (suspected) exposure to covid-19: Secondary | ICD-10-CM

## 2019-08-22 LAB — NOVEL CORONAVIRUS, NAA: SARS-CoV-2, NAA: NOT DETECTED

## 2019-10-28 ENCOUNTER — Ambulatory Visit: Payer: BC Managed Care – PPO | Admitting: Nurse Practitioner

## 2019-11-02 ENCOUNTER — Encounter: Payer: Self-pay | Admitting: Nurse Practitioner

## 2019-11-02 ENCOUNTER — Other Ambulatory Visit: Payer: Self-pay

## 2019-11-02 ENCOUNTER — Ambulatory Visit: Payer: BC Managed Care – PPO | Admitting: Nurse Practitioner

## 2019-11-02 VITALS — BP 108/70 | HR 86 | Temp 98.2°F | Ht 66.6 in | Wt 176.6 lb

## 2019-11-02 DIAGNOSIS — R7309 Other abnormal glucose: Secondary | ICD-10-CM | POA: Diagnosis not present

## 2019-11-02 DIAGNOSIS — F329 Major depressive disorder, single episode, unspecified: Secondary | ICD-10-CM

## 2019-11-02 DIAGNOSIS — G4700 Insomnia, unspecified: Secondary | ICD-10-CM

## 2019-11-02 DIAGNOSIS — E782 Mixed hyperlipidemia: Secondary | ICD-10-CM

## 2019-11-02 DIAGNOSIS — F32A Depression, unspecified: Secondary | ICD-10-CM

## 2019-11-02 DIAGNOSIS — J3489 Other specified disorders of nose and nasal sinuses: Secondary | ICD-10-CM

## 2019-11-02 DIAGNOSIS — Z23 Encounter for immunization: Secondary | ICD-10-CM | POA: Diagnosis not present

## 2019-11-02 MED ORDER — VORTIOXETINE HBR 10 MG PO TABS: 10.0000 mg | ORAL_TABLET | Freq: Every day | ORAL | 1 refills | Status: DC

## 2019-11-02 MED ORDER — TRAZODONE HCL 50 MG PO TABS: 50.0000 mg | ORAL_TABLET | Freq: Every day | ORAL | 4 refills | Status: DC

## 2019-11-02 NOTE — Progress Notes (Signed)
This visit occurred during the SARS-CoV-2 public health emergency.  Safety protocols were in place, including screening questions prior to the visit, additional usage of staff PPE, and extensive cleaning of exam room while observing appropriate contact time as indicated for disinfecting solutions.  Subjective:     Patient ID: Valerie Stuart , female    DOB: May 20, 1956 , 63 y.o.   MRN: [id number]   Chief Complaint  Patient presents with  . abnormal glucose    HPI  25mg  of trazodone is working better for her 50 mg causes her grogginess in the morning.   Depression        This is a chronic problem.  The current episode started more than 1 year ago.   The onset quality is gradual.   The problem occurs intermittently.  Associated symptoms include insomnia.  Associated symptoms include no decreased concentration, no fatigue and no headaches.     The symptoms are aggravated by nothing. Insomnia The current episode started more than one month. The onset quality is sudden. The problem occurs intermittently. PMH includes: depression.     Past Medical History:  Diagnosis Date  . Hyperlipidemia      Family History  Problem Relation Age of Onset  . Heart disease Mother   . Hypertension Mother   . Alzheimer's disease Father   . Hypertension Brother      Current Outpatient Medications:  .  calcium citrate (CALCITRATE - DOSED IN MG ELEMENTAL CALCIUM) 950 MG tablet, Take 200 mg of elemental calcium by mouth 4 (four) times daily., Disp: , Rfl:  .  calcium-vitamin D (OSCAL WITH D) 500-200 MG-UNIT tablet, Take 1 tablet by mouth daily with breakfast., Disp: , Rfl:  .  Cholecalciferol (D-3-5) 5000 units capsule, Take 5,000 Units by mouth daily., Disp: , Rfl:  .  cyanocobalamin 500 MCG tablet, Take 500 mcg by mouth daily., Disp: , Rfl:  .  IRON-B12-VIT C-FA-IFC PO, Take 29 mg by mouth daily., Disp: , Rfl:  .  Multiple Vitamin (MULTIVITAMIN) tablet, Take 1 tablet by mouth daily., Disp: , Rfl:  .   traZODone (DESYREL) 50 MG tablet, Take 1 tablet (50 mg total) by mouth at bedtime., Disp: 30 tablet, Rfl: 4 .  vitamin A 10000 UNIT capsule, Take 10,000 Units by mouth daily., Disp: , Rfl:  .  VITAMIN E PO, Take 300 Units by mouth daily., Disp: , Rfl:  .  vortioxetine HBr (TRINTELLIX) 10 MG TABS tablet, Take 1 tablet (10 mg total) by mouth daily., Disp: 90 tablet, Rfl: 1   Allergies  Allergen Reactions  . Bee Venom Anaphylaxis     Review of Systems  Constitutional: Negative for fatigue.  HENT: Positive for sinus pressure (left side facial pressure). Negative for facial swelling and sore throat.   Respiratory: Negative for cough.   Cardiovascular: Negative.  Negative for chest pain, palpitations and leg swelling.  Musculoskeletal: Negative.   Neurological: Negative for headaches.  Psychiatric/Behavioral: Positive for depression. Negative for agitation and decreased concentration. The patient has insomnia.      Today's Vitals   11/02/19 1655  BP: 108/70  Pulse: 86  Temp: 98.2 F (36.8 C)  TempSrc: Oral  Weight: 176 lb 9.6 oz (80.1 kg)  Height: 5' 6.6" (1.692 m)  PainSc: 0-No pain   Body mass index is 27.99 kg/m.   Objective:  Physical Exam Constitutional:      General: She is not in acute distress.    Appearance: Normal appearance.  Cardiovascular:  Rate and Rhythm: Normal rate and regular rhythm.     Pulses: Normal pulses.     Heart sounds: Normal heart sounds. No murmur.  Pulmonary:     Effort: Pulmonary effort is normal. No respiratory distress.     Breath sounds: Normal breath sounds.  Skin:    General: Skin is warm.     Capillary Refill: Capillary refill takes less than 2 seconds.  Neurological:     General: No focal deficit present.     Mental Status: She is alert and oriented to person, place, and time.  Psychiatric:        Mood and Affect: Mood normal.        Behavior: Behavior normal.        Thought Content: Thought content normal.        Judgment:  Judgment normal.         Assessment And Plan:     1. Need for influenza vaccination  Influenza vaccine given in office  Advised to take Tylenol as needed for muscle aches or fever - Flu Vaccine QUAD 6+ mos PF IM (Fluarix Quad PF)  2. Mixed hyperlipidemia  Chronic, controlled  no current medications  3. Abnormal glucose  Chronic, stable  No current medications  Continue with exercise and diet - Hemoglobin A1c  4. Insomnia, unspecified type  Chronic, stable  She is doing well with current treatment - traZODone (DESYREL) 50 MG tablet; Take 1 tablet (50 mg total) by mouth at bedtime.  Dispense: 30 tablet; Refill: 4  5. Sinus pressure  Mild pressure to frontal sinuses  Use normal saline nasal spray or flonase daily   6. Depression, unspecified depression type  Chronic, doing well  No changes to current regimen - vortioxetine HBr (TRINTELLIX) 10 MG TABS tablet; Take 1 tablet (10 mg total) by mouth daily.  Dispense: 90 tablet; Refill: 1   Minette Brine, FNP    THE PATIENT IS ENCOURAGED TO PRACTICE SOCIAL DISTANCING DUE TO THE COVID-19 PANDEMIC.

## 2019-11-03 LAB — BMP8+EGFR
BUN/Creatinine Ratio: 21 (ref 12–28)
BUN: 17 mg/dL (ref 8–27)
CO2: 22 mmol/L (ref 20–29)
Calcium: 9.1 mg/dL (ref 8.7–10.3)
Chloride: 109 mmol/L — ABNORMAL HIGH (ref 96–106)
Creatinine, Ser: 0.82 mg/dL (ref 0.57–1.00)
GFR calc Af Amer: 89 mL/min/{1.73_m2} (ref >59)
GFR calc non Af Amer: 77 mL/min/{1.73_m2} (ref >59)
Glucose: 84 mg/dL (ref 65–99)
Potassium: 3.8 mmol/L (ref 3.5–5.2)
Sodium: 144 mmol/L (ref 134–144)

## 2019-11-03 LAB — HGB A1C W/O EAG: Hgb A1c MFr Bld: 5.1 % (ref 4.8–5.6)

## 2019-11-11 ENCOUNTER — Encounter: Payer: Self-pay | Admitting: Nurse Practitioner

## 2020-04-06 ENCOUNTER — Other Ambulatory Visit: Payer: Self-pay

## 2020-04-06 ENCOUNTER — Ambulatory Visit: Payer: BC Managed Care – PPO | Admitting: Nurse Practitioner

## 2020-04-06 ENCOUNTER — Encounter: Payer: Self-pay | Admitting: Nurse Practitioner

## 2020-04-06 VITALS — BP 118/72 | HR 75 | Temp 98.2°F | Ht 66.8 in | Wt 184.0 lb

## 2020-04-06 DIAGNOSIS — M21612 Bunion of left foot: Secondary | ICD-10-CM

## 2020-04-06 MED ORDER — PREDNISONE 10 MG (21) PO TBPK: ORAL_TABLET | ORAL | 0 refills | Status: DC

## 2020-04-06 MED ORDER — DICLOFENAC SODIUM 1 % EX GEL: 2.0000 g | Freq: Four times a day (QID) | CUTANEOUS | 1 refills | Status: DC

## 2020-04-06 NOTE — Progress Notes (Signed)
Established Patient Office Visit  Subjective:  Patient ID: Valerie Stuart, female    DOB: [birth year]  Age: 64 y.o. MRN: [id number]  CC:  Chief Complaint  Patient presents with  . Foot Pain    (L) foot pain    HPI Valerie Stuart presents today for left foot pain. The pain started this weekend and has gotten worse and swelling. She was washing her case and thru the day her pain became worse. She is scheduled to see Dr. Paulla Dolly on June 9th. The pain is a 6/10. She has taken OTC naproxen sodium and elevating the foot. She also has tried epsom salt but no ice or heat. The pain decreases to a 3/10 with the Naproxen. She will try to minimize how much she is walking on her foot and feels like she is compensating by walking on the side of her foot.  She has a eight pound weight gain from her toe issue and she has stated drinking sugary drinks and eating sweets.   Wt Readings from Last 3 Encounters:  04/06/20 184 lb (83.5 kg)  11/02/19 176 lb 9.6 oz (80.1 kg)  04/28/19 172 lb 6.4 oz (78.2 kg)     Past Medical History:  Diagnosis Date  . Hyperlipidemia     Past Surgical History:  Procedure Laterality Date  . BREATH TEK H PYLORI N/A 04/22/2015   Procedure: BREATH TEK H PYLORI;  Surgeon: Greer Pickerel, MD;  Location: [facility];  Service: General;  Laterality: N/A;  . 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 TONSILLECTOMY      Family History  Problem Relation Age of Onset  . Heart disease Mother   . Hypertension Mother   . Alzheimer's disease Father   . Hypertension Brother     Social History   Socioeconomic History  . Marital status: Married    Spouse name: Not on file  . Number of children: Not on file  . Years of education: Not on file  . Highest education level: Not on file  Occupational History  . 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 Smoking status: Never Smoker  . Smokeless tobacco: Never Used  Substance and Sexual Activity  . Alcohol use: No  . Drug use: No  . Sexual activity: Yes  Other Topics Concern  . Not on file  Social History Narrative  . Not on file   Social Determinants of Health   Financial Resource Strain:   . Difficulty of Paying Living Expenses:   Food Insecurity:   . Worried About Charity fundraiser in the Last Year:   . Arboriculturist in the Last Year:   Transportation Needs:   . Film/video editor (Medical):   Marland Kitchen Lack of Transportation (Non-Medical):   Physical Activity:   . Days of Exercise per Week:   . Minutes of Exercise per Session:   Stress:   . Feeling of Stress :   Social Connections:   . Frequency of Communication with Friends and Family:   . Frequency of Social Gatherings with Friends and Family:   . Attends Religious Services:   . Active Member of Clubs or Organizations:   . Attends Archivist Meetings:   Marland Kitchen Marital Status:   Intimate Partner Violence:   . Fear of Current or Ex-Partner:   . Emotionally Abused:   .  Physically Abused:   . Sexually Abused:     Outpatient Medications Prior to Visit  Medication Sig Dispense Refill  . calcium citrate (CALCITRATE - DOSED IN MG ELEMENTAL CALCIUM) 950 MG tablet Take 200 mg of elemental calcium by mouth 4 (four) times daily.    . calcium-vitamin D (OSCAL WITH D) 500-200 MG-UNIT tablet Take 1 tablet by mouth daily with breakfast.    . Cholecalciferol (D-3-5) 5000 units capsule Take 5,000 Units by mouth daily.    . cyanocobalamin 500 MCG tablet Take 500 mcg by mouth daily.    Marland Kitchen IRON-B12-VIT C-FA-IFC PO Take 29 mg by mouth daily.    . Multiple Vitamin (MULTIVITAMIN) tablet Take 1 tablet by mouth daily.    . traZODone (DESYREL) 50 MG tablet Take 1 tablet (50 mg total) by mouth at bedtime. 30 tablet 4  . vitamin A 10000 UNIT capsule Take 10,000 Units by mouth daily.    Marland Kitchen VITAMIN E PO Take 300 Units by mouth daily.     Marland Kitchen vortioxetine HBr (TRINTELLIX) 10 MG TABS tablet Take 1 tablet (10 mg total) by mouth daily. 90 tablet 1   No facility-administered medications prior to visit.    Allergies  Allergen Reactions  . Bee Venom Anaphylaxis    ROS Review of Systems  Constitutional: Negative.   Respiratory: Negative.   Cardiovascular: Negative.   Musculoskeletal: Positive for arthralgias and joint swelling.       Bunion present on left great toe  Skin: Positive for color change.       Redness at left great toes with ankle swelling      Objective:    Physical Exam  Constitutional: She appears well-developed and well-nourished. No distress.  Cardiovascular: Normal rate, regular rhythm and normal heart sounds.  Musculoskeletal:        General: Edema present.     Comments: Left toe  Skin: There is erythema.  Left toe  Psychiatric: She has a normal mood and affect.    BP 118/72 (BP Location: Right Arm, Patient Position: Sitting, Cuff Size: Normal)   Pulse 75   Temp 98.2 F (36.8 C) (Oral)   Ht 5' 6.8" (1.697 m)   Wt 184 lb (83.5 kg)   SpO2 98%   BMI 28.99 kg/m  Wt Readings from Last 3 Encounters:  04/06/20 184 lb (83.5 kg)  11/02/19 176 lb 9.6 oz (80.1 kg)  04/28/19 172 lb 6.4 oz (78.2 kg)     Health Maintenance Due  Topic Date Due  . OPHTHALMOLOGY EXAM  Never done  . URINE MICROALBUMIN  04/27/2020    There are no preventive care reminders to display for this patient.  No results found for: TSH Lab Results  Component Value Date   WBC 6.0 04/28/2019   HGB 11.4 04/28/2019   HCT 36.1 04/28/2019   MCV 84 04/28/2019   PLT 291 04/28/2019   Lab Results  Component Value Date   NA 144 11/02/2019   K 3.8 11/02/2019   CO2 22 11/02/2019   GLUCOSE 84 11/02/2019   BUN 17 11/02/2019   CREATININE 0.82 11/02/2019   BILITOT 0.4 04/28/2019   ALKPHOS 116 04/28/2019   AST 19 04/28/2019   ALT 8 04/28/2019   PROT 7.3 04/28/2019   ALBUMIN 4.1 04/28/2019   CALCIUM 9.1 11/02/2019    Lab Results  Component Value Date   CHOL 161 04/28/2019   Lab Results  Component Value Date   HDL 71 04/28/2019   Lab Results  Component  Value Date   LDLCALC 79 04/28/2019   Lab Results  Component Value Date   TRIG 57 04/28/2019   Lab Results  Component Value Date   CHOLHDL 2.3 04/28/2019   Lab Results  Component Value Date   HGBA1C 5.1 11/02/2019      Assessment & Plan:   1. Bunion of great toe of left foot  Keep your appt with Dr. Paulla Dolly and referral has been made - diclofenac Sodium (VOLTAREN) 1 % GEL; Apply 2 g topically 4 (four) times daily.  Dispense: 100 g; Refill: 1 - Ambulatory referral to Podiatry - predniSONE (STERAPRED UNI-PAK 21 TAB) 10 MG (21) TBPK tablet; Take as directed  Dispense: 21 tablet; Refill: 0    Marylu Lund, RN

## 2020-04-06 NOTE — Patient Instructions (Signed)
voltaren gel if unable to get diclofenac.

## 2020-04-20 ENCOUNTER — Encounter: Payer: Self-pay | Admitting: Podiatry

## 2020-04-20 ENCOUNTER — Ambulatory Visit: Payer: BC Managed Care – PPO | Admitting: Podiatry

## 2020-04-20 ENCOUNTER — Other Ambulatory Visit: Payer: Self-pay | Admitting: Podiatry

## 2020-04-20 ENCOUNTER — Ambulatory Visit (INDEPENDENT_AMBULATORY_CARE_PROVIDER_SITE_OTHER): Payer: BC Managed Care – PPO

## 2020-04-20 ENCOUNTER — Other Ambulatory Visit: Payer: Self-pay

## 2020-04-20 DIAGNOSIS — M79672 Pain in left foot: Secondary | ICD-10-CM | POA: Diagnosis not present

## 2020-04-20 DIAGNOSIS — M76822 Posterior tibial tendinitis, left leg: Secondary | ICD-10-CM | POA: Diagnosis not present

## 2020-04-20 DIAGNOSIS — M79671 Pain in right foot: Secondary | ICD-10-CM | POA: Diagnosis not present

## 2020-04-20 DIAGNOSIS — M21619 Bunion of unspecified foot: Secondary | ICD-10-CM | POA: Diagnosis not present

## 2020-04-20 DIAGNOSIS — M21611 Bunion of right foot: Secondary | ICD-10-CM

## 2020-04-20 NOTE — Patient Instructions (Signed)
Bunion  A bunion is a bump on the base of the big toe that forms when the bones of the big toe joint move out of position. Bunions may be small at first, but they often get larger over time. They can make walking painful. What are the causes? A bunion may be caused by:  Wearing narrow or pointed shoes that force the big toe to press against the other toes.  Abnormal foot development that causes the foot to roll inward (pronate).  Changes in the foot that are caused by certain diseases, such as rheumatoid arthritis or polio.  A foot injury. What increases the risk? The following factors may make you more likely to develop this condition:  Wearing shoes that squeeze the toes together.  Having certain diseases, such as: ? Rheumatoid arthritis. ? Polio. ? Cerebral palsy.  Having family members who have bunions.  Being born with a foot deformity, such as flat feet or low arches.  Doing activities that put a lot of pressure on the feet, such as ballet dancing. What are the signs or symptoms? The main symptom of a bunion is a noticeable bump on the big toe. Other symptoms may include:  Pain.  Swelling around the big toe.  Redness and inflammation.  Thick or hardened skin on the big toe or between the toes.  Stiffness or loss of motion in the big toe.  Trouble with walking. How is this diagnosed? A bunion may be diagnosed based on your symptoms, medical history, and activities. You may have tests, such as:  X-rays. These allow your health care provider to check the position of the bones in your foot and look for damage to your joint. They also help your health care provider determine the severity of your bunion and the best way to treat it.  Joint aspiration. In this test, a sample of fluid is removed from the toe joint. This test may be done if you are in a lot of pain. It helps rule out diseases that cause painful swelling of the joints, such as arthritis. How is this  treated? Treatment depends on the severity of your symptoms. The goal of treatment is to relieve symptoms and prevent the bunion from getting worse. Your health care provider may recommend:  Wearing shoes that have a wide toe box.  Using bunion pads to cushion the affected area.  Taping your toes together to keep them in a normal position.  Placing a device inside your shoe (orthotics) to help reduce pressure on your toe joint.  Taking medicine to ease pain, inflammation, and swelling.  Applying heat or ice to the affected area.  Doing stretching exercises.  Surgery to remove scar tissue and move the toes back into their normal position. This treatment is rare. Follow these instructions at home: Managing pain, stiffness, and swelling   If directed, put ice on the painful area: ? Put ice in a plastic bag. ? Place a towel between your skin and the bag. ? Leave the ice on for 20 minutes, 2-3 times a day. Activity   If directed, apply heat to the affected area before you exercise. Use the heat source that your health care provider recommends, such as a moist heat pack or a heating pad. ? Place a towel between your skin and the heat source. ? Leave the heat on for 20-30 minutes. ? Remove the heat if your skin turns bright red. This is especially important if you are unable to feel pain,   heat, or cold. You may have a greater risk of getting burned.  Do exercises as told by your health care provider. General instructions  Support your toe joint with proper footwear, shoe padding, or taping as told by your health care provider.  Take over-the-counter and prescription medicines only as told by your health care provider.  Keep all follow-up visits as told by your health care provider. This is important. Contact a health care provider if your symptoms:  Get worse.  Do not improve in 2 weeks. Get help right away if you have:  Severe pain and trouble with walking. Summary  A  bunion is a bump on the base of the big toe that forms when the bones of the big toe joint move out of position.  Bunions can make walking painful.  Treatment depends on the severity of your symptoms.  Support your toe joint with proper footwear, shoe padding, or taping as told by your health care provider. This information is not intended to replace advice given to you by your health care provider. Make sure you discuss any questions you have with your health care provider. Document Revised: 05/05/2018 Document Reviewed: 03/11/2018 Elsevier Patient Education  2020 Elsevier Inc.  

## 2020-04-21 NOTE — Progress Notes (Signed)
Subjective:   Patient ID: Valerie Stuart, female   DOB: 64 y.o.   MRN: [id number]   HPI Patient presents stating she is having problems with the bunion on her left foot which is been present and becoming more irritated over the last few years.  Also has developed ankle pain left and that has partially resolved but still sore and has taken prednisone and has utilized Voltaren gel.  Patient does not smoke likes to be active and has family history of bunion deformity   Review of Systems  All other systems reviewed and are negative.       Objective:  Physical Exam Vitals and nursing note reviewed.  Constitutional:      Appearance: She is well-developed.  Pulmonary:     Effort: Pulmonary effort is normal.  Musculoskeletal:        General: Normal range of motion.  Skin:    General: Skin is warm.  Neurological:     Mental Status: She is alert.     Neurovascular status found to be intact muscle strength is adequate range of motion within normal limits.  Patient is noted to have prominent bunion deformity left over right that is red and irritated with shoe gear and is noted to have significant collapse medial longitudinal arch left over right.  Patient has good digital perfusion well oriented x3 and I checked the strength the posterior tibial tendon and did not note significant loss of strength with possible stretching of the left posterior tibial tendon     Assessment:  Structural bunion deformity left over right with symptomatic pain and failure to respond conservatively along with posterior tibial tendinitis left     Plan:  8 H&P reviewed both conditions and x-ray.  At this point I do think structural bunion to be necessary distal osteotomy should take care of this and I educated her on this and she wants to get it done but needs to wait a couple months to do this.  I did go ahead today and I casted her for functional orthotics to try to support her arch along with fascial brace to  lift the arch up now and topical medicine and ice therapy.  She will be seen back by ped orthotist to dispense orthotics and will have surgical consult prior to procedure  X-rays indicate that there is structural bunion deformity left over right with elevation of the intermetatarsal angle and depression of the arch left over right

## 2020-05-04 ENCOUNTER — Encounter: Payer: BC Managed Care – PPO | Admitting: Nurse Practitioner

## 2020-05-12 ENCOUNTER — Other Ambulatory Visit: Payer: Self-pay

## 2020-05-12 ENCOUNTER — Ambulatory Visit: Payer: BC Managed Care – PPO | Admitting: Orthotics

## 2020-05-12 DIAGNOSIS — M79671 Pain in right foot: Secondary | ICD-10-CM

## 2020-05-12 DIAGNOSIS — M79672 Pain in left foot: Secondary | ICD-10-CM

## 2020-05-12 DIAGNOSIS — M76822 Posterior tibial tendinitis, left leg: Secondary | ICD-10-CM

## 2020-05-12 NOTE — Progress Notes (Signed)
Patient came in today to pick up custom made foot orthotics.  The goals were accomplished and the patient reported no dissatisfaction with said orthotics.  Patient was advised of breakin period and how to report any issues. 

## 2020-05-30 ENCOUNTER — Ambulatory Visit: Payer: BC Managed Care – PPO | Admitting: Nurse Practitioner

## 2020-05-30 ENCOUNTER — Other Ambulatory Visit: Payer: Self-pay

## 2020-05-30 ENCOUNTER — Encounter: Payer: Self-pay | Admitting: Nurse Practitioner

## 2020-05-30 VITALS — BP 118/74 | HR 78 | Temp 98.1°F | Ht 67.4 in | Wt 179.4 lb

## 2020-05-30 DIAGNOSIS — M21612 Bunion of left foot: Secondary | ICD-10-CM | POA: Diagnosis not present

## 2020-05-30 DIAGNOSIS — E663 Overweight: Secondary | ICD-10-CM | POA: Diagnosis not present

## 2020-05-30 DIAGNOSIS — F32A Depression, unspecified: Secondary | ICD-10-CM

## 2020-05-30 DIAGNOSIS — G4700 Insomnia, unspecified: Secondary | ICD-10-CM | POA: Diagnosis not present

## 2020-05-30 DIAGNOSIS — F329 Major depressive disorder, single episode, unspecified: Secondary | ICD-10-CM | POA: Diagnosis not present

## 2020-05-30 MED ORDER — VORTIOXETINE HBR 20 MG PO TABS: 20.0000 mg | ORAL_TABLET | Freq: Every day | ORAL | 1 refills | Status: DC

## 2020-05-30 NOTE — Progress Notes (Signed)
Subjective:     Patient ID: Valerie Stuart , female    DOB: [birth year] , 64 y.o.   MRN: [id number]   Chief Complaint  Patient presents with  . Insomnia    patient needs a refill on her trintellix     HPI  She continues to have episodes of waking up at 4 am, had been taking trazodone but she has grogginess.  She has tried melatonin but did not keep her asleep.  Wt Readings from Last 3 Encounters: 05/30/20 : 179 lb 6.4 oz (81.4 kg) 04/06/20 : 184 lb (83.5 kg) 11/02/19 : 176 lb 9.6 oz (80.1 kg)     Anxiety Presents for follow-up visit. Symptoms include insomnia. Patient reports no chest pain, decreased concentration or palpitations. Primary symptoms comment: awakens at 2 or 3 am if does not take Benadryl.  . The severity of symptoms is moderate.    Insomnia Primary symptoms: no fragmented sleep.  The current episode started more than one year. The onset quality is gradual. The problem occurs nightly. The problem has been gradually worsening since onset. The symptoms are aggravated by anxiety. Treatments tried: benadryl. Typical bedtime:  11-12 P.M..  PMH includes: associated symptoms present, no hypertension.     Past Medical History:  Diagnosis Date  . Hyperlipidemia      Family History  Problem Relation Age of Onset  . Heart disease Mother   . Hypertension Mother   . Alzheimer's disease Father   . Hypertension Brother      Current Outpatient Medications:  .  calcium-vitamin D (OSCAL WITH D) 500-200 MG-UNIT tablet, Take 1 tablet by mouth daily with breakfast., Disp: , Rfl:  .  cyanocobalamin 500 MCG tablet, Take 500 mcg by mouth daily., Disp: , Rfl:  .  diclofenac Sodium (VOLTAREN) 1 % GEL, Apply 2 g topically 4 (four) times daily., Disp: 100 g, Rfl: 1 .  IRON-B12-VIT C-FA-IFC PO, Take 29 mg by mouth daily., Disp: , Rfl:  .  Multiple Vitamin (MULTIVITAMIN) tablet, Take 1 tablet by mouth daily., Disp: , Rfl:  .  vitamin A 10000 UNIT capsule, Take 10,000 Units by mouth  daily., Disp: , Rfl:  .  VITAMIN E PO, Take 300 Units by mouth daily., Disp: , Rfl:  .  vortioxetine HBr (TRINTELLIX) 10 MG TABS tablet, Take 1 tablet (10 mg total) by mouth daily., Disp: 90 tablet, Rfl: 1 .  citalopram (CELEXA) 20 MG tablet, Take by mouth. (Patient not taking: Reported on 05/30/2020), Disp: , Rfl:    Allergies  Allergen Reactions  . Bee Venom Anaphylaxis     Review of Systems  Constitutional: Negative for fatigue.  Respiratory: Negative for cough.   Cardiovascular: Negative for chest pain, palpitations and leg swelling.  Endocrine: Negative for polydipsia, polyphagia and polyuria.  Psychiatric/Behavioral: Negative for decreased concentration. The patient has insomnia.      Today's Vitals   05/30/20 1048  BP: 118/74  Pulse: 78  Temp: 98.1 F (36.7 C)  TempSrc: Oral  Weight: 179 lb 6.4 oz (81.4 kg)  Height: 5' 7.4" (1.712 m)  PainSc: 0-No pain   Body mass index is 27.77 kg/m.   Objective:  Physical Exam Vitals reviewed.  Constitutional:      General: She is not in acute distress.    Appearance: Normal appearance. She is well-developed.  Eyes:     Pupils: Pupils are equal, round, and reactive to light.  Cardiovascular:     Rate and Rhythm: Normal rate and regular  rhythm.     Pulses: Normal pulses.     Heart sounds: Normal heart sounds. No murmur heard.   Pulmonary:     Effort: Pulmonary effort is normal.     Breath sounds: Normal breath sounds.  Musculoskeletal:        General: Normal range of motion.  Skin:    General: Skin is warm and dry.     Capillary Refill: Capillary refill takes less than 2 seconds.  Neurological:     General: No focal deficit present.     Mental Status: She is alert and oriented to person, place, and time.     Cranial Nerves: No cranial nerve deficit.  Psychiatric:        Mood and Affect: Mood normal.        Behavior: Behavior normal.        Thought Content: Thought content normal.        Judgment: Judgment normal.          Assessment And Plan:      1. Depression, unspecified depression type  Chronic, she is waking up more in the middle of the night  Will increase her trintellix to see if helps with her sleep.  - vortioxetine HBr (TRINTELLIX) 20 MG TABS tablet; Take 1 tablet (20 mg total) by mouth daily.  Dispense: 90 tablet; Refill: 1  2. Insomnia, unspecified type  She did not tolerate the 1/2 tab trazodone, continues to make her groggy in the morning.  She also feels some of her sleep problems are related to her husband snoring.  - vortioxetine HBr (TRINTELLIX) 20 MG TABS tablet; Take 1 tablet (20 mg total) by mouth daily.  Dispense: 90 tablet; Refill: 1  3. Bunion of great toe of left foot  She is seeing the podiatrist who has made an orthotic and would like to do surgery.    4. Overweight (BMI 25.0-29.9)  She has had a 6 lb weight loss since her last visit  Encouraged to incorporate light strength training   Minette Brine, FNP

## 2020-05-30 NOTE — Patient Instructions (Signed)
[place]

## 2020-08-04 ENCOUNTER — Other Ambulatory Visit: Payer: Self-pay | Admitting: Nurse Practitioner

## 2020-08-04 DIAGNOSIS — Z1231 Encounter for screening mammogram for malignant neoplasm of breast: Secondary | ICD-10-CM

## 2020-08-09 ENCOUNTER — Ambulatory Visit
Admission: RE | Admit: 2020-08-09 | Discharge: 2020-08-09 | Disposition: A | Discharge status: Home / Self Care | Payer: BC Managed Care – PPO | Source: Ambulatory Visit | Attending: Nurse Practitioner | Admitting: Nurse Practitioner

## 2020-08-09 ENCOUNTER — Other Ambulatory Visit: Payer: Self-pay

## 2020-08-09 DIAGNOSIS — Z1231 Encounter for screening mammogram for malignant neoplasm of breast: Secondary | ICD-10-CM

## 2020-08-11 ENCOUNTER — Other Ambulatory Visit: Payer: Self-pay | Admitting: Nurse Practitioner

## 2020-08-11 DIAGNOSIS — R928 Other abnormal and inconclusive findings on diagnostic imaging of breast: Secondary | ICD-10-CM

## 2020-08-25 ENCOUNTER — Other Ambulatory Visit: Payer: Self-pay

## 2020-08-25 ENCOUNTER — Ambulatory Visit: Payer: BC Managed Care – PPO

## 2020-08-25 ENCOUNTER — Ambulatory Visit
Admission: RE | Admit: 2020-08-25 | Discharge: 2020-08-25 | Disposition: A | Discharge status: Home / Self Care | Payer: BC Managed Care – PPO | Source: Ambulatory Visit | Attending: Nurse Practitioner | Admitting: Nurse Practitioner

## 2020-08-25 DIAGNOSIS — R928 Other abnormal and inconclusive findings on diagnostic imaging of breast: Secondary | ICD-10-CM

## 2020-09-05 ENCOUNTER — Encounter: Payer: BC Managed Care – PPO | Admitting: Nurse Practitioner

## 2020-12-29 ENCOUNTER — Ambulatory Visit: Payer: BC Managed Care – PPO | Admitting: Nurse Practitioner

## 2020-12-29 ENCOUNTER — Encounter: Payer: Self-pay | Admitting: Nurse Practitioner

## 2020-12-29 ENCOUNTER — Other Ambulatory Visit: Payer: Self-pay

## 2020-12-29 VITALS — BP 116/64 | HR 71 | Temp 97.6°F | Ht 67.3 in | Wt 187.0 lb

## 2020-12-29 DIAGNOSIS — R7309 Other abnormal glucose: Secondary | ICD-10-CM | POA: Diagnosis not present

## 2020-12-29 DIAGNOSIS — Z23 Encounter for immunization: Secondary | ICD-10-CM

## 2020-12-29 DIAGNOSIS — F419 Anxiety disorder, unspecified: Secondary | ICD-10-CM | POA: Diagnosis not present

## 2020-12-29 DIAGNOSIS — Z0000 Encounter for general adult medical examination without abnormal findings: Secondary | ICD-10-CM | POA: Diagnosis not present

## 2020-12-29 DIAGNOSIS — Z9884 Bariatric surgery status: Secondary | ICD-10-CM

## 2020-12-29 DIAGNOSIS — G4709 Other insomnia: Secondary | ICD-10-CM

## 2020-12-29 DIAGNOSIS — E782 Mixed hyperlipidemia: Secondary | ICD-10-CM | POA: Diagnosis not present

## 2020-12-29 DIAGNOSIS — R5383 Other fatigue: Secondary | ICD-10-CM

## 2020-12-29 MED ORDER — VORTIOXETINE HBR 5 MG PO TABS: ORAL_TABLET | ORAL | 5 refills | Status: DC

## 2020-12-29 MED ORDER — SHINGRIX 50 MCG/0.5ML IM SUSR: 0.5000 mL | Freq: Once | INTRAMUSCULAR | 0 refills | Status: AC

## 2020-12-29 NOTE — Progress Notes (Signed)
This visit occurred during the SARS-CoV-2 public health emergency.  Safety protocols were in place, including screening questions prior to the visit, additional usage of staff PPE, and extensive cleaning of exam room while observing appropriate contact time as indicated for disinfecting solutions.  Subjective:     Patient ID: Valerie Stuart , female    DOB: [birth year] , 65 y.o.   MRN: [id number]   Chief Complaint  Patient presents with  . Annual Exam    HPI  Patient presents today for her hm. She is due for her ophthalmology appt in March 17th. She is only taking 10 mg of trintellix at bedtime because the 20 mg makes her "feel detached". She is taking melatonin for sleep.  She will usually wake up around 4 am every morning.   Wt Readings from Last 3 Encounters: 12/29/20 : 187 lb (84.8 kg) 05/30/20 : 179 lb 6.4 oz (81.4 kg) 04/06/20 : 184 lb (83.5 kg)    Past Medical History:  Diagnosis Date  . Hyperlipidemia      Family History  Problem Relation Age of Onset  . Heart disease Mother   . Hypertension Mother   . Alzheimer's disease Father   . Hypertension Brother      Current Outpatient Medications:  .  vortioxetine HBr (TRINTELLIX) 20 MG TABS tablet, Take 1 tablet (20 mg total) by mouth daily., Disp: 90 tablet, Rfl: 1   Allergies  Allergen Reactions  . Bee Venom Anaphylaxis      The patient states she uses post menopausal status for birth control.  No LMP recorded. Patient is postmenopausal.. Negative for Dysmenorrhea and Negative for Menorrhagia. Negative for: breast discharge, breast lump(s), breast pain and breast self exam. Associated symptoms include abnormal vaginal bleeding. Pertinent negatives include abnormal bleeding (hematology), anxiety, decreased libido, depression, difficulty falling sleep, dyspareunia, history of infertility, nocturia, sexual dysfunction, sleep disturbances, urinary incontinence, urinary urgency, vaginal discharge and vaginal itching. Diet  regular; at one point she was craving more sugar. The patient states her exercise level is minimal   The patient's tobacco use is:  Social History   Tobacco Use  Smoking Status Never Smoker  Smokeless Tobacco Never Used   She has been exposed to passive smoke. The patient's alcohol use is:  Social History   Substance and Sexual Activity  Alcohol Use No   Additional information: Last pap 04/18/2018, next one scheduled for 04/18/2021    Review of Systems  Constitutional: Positive for fatigue.  HENT: Negative.        She is reporting her left nostril is stopped up and her ear is clogged.   Eyes: Negative.   Respiratory: Negative.  Negative for shortness of breath.   Cardiovascular: Negative.  Negative for chest pain, palpitations and leg swelling.  Gastrointestinal: Negative.   Endocrine: Negative.   Genitourinary: Negative.   Musculoskeletal: Negative.   Skin: Negative.   Allergic/Immunologic: Negative.   Neurological: Negative.   Hematological: Negative.   Psychiatric/Behavioral: Positive for sleep disturbance.     Today's Vitals   12/29/20 1541  BP: 116/64  Pulse: 71  Temp: 97.6 F (36.4 C)  TempSrc: Oral  SpO2: 98%  Weight: 187 lb (84.8 kg)  Height: 5' 7.3" (1.709 m)  PainSc: 0-No pain   Body mass index is 29.03 kg/m.   Objective:  Physical Exam Constitutional:      General: She is not in acute distress.    Appearance: Normal appearance. She is well-developed. She is obese.  HENT:  Head: Normocephalic and atraumatic.     Right Ear: Hearing, tympanic membrane, ear canal and external ear normal. There is no impacted cerumen.     Left Ear: Hearing, tympanic membrane, ear canal and external ear normal. There is no impacted cerumen.     Nose:     Comments: Deferred - masked    Mouth/Throat:     Comments: Deferred - masked Eyes:     General: Lids are normal.     Extraocular Movements: Extraocular movements intact.     Conjunctiva/sclera: Conjunctivae  normal.     Pupils: Pupils are equal, round, and reactive to light.     Funduscopic exam:    Right eye: No papilledema.        Left eye: No papilledema.  Neck:     Thyroid: No thyroid mass.     Vascular: No carotid bruit.  Cardiovascular:     Rate and Rhythm: Normal rate and regular rhythm.     Pulses: Normal pulses.     Heart sounds: Normal heart sounds. No murmur heard.   Pulmonary:     Effort: Pulmonary effort is normal.     Breath sounds: Normal breath sounds.  Chest:     Chest wall: No mass.  Breasts:     Tanner Score is 5.     Right: Normal. No mass, tenderness, axillary adenopathy or supraclavicular adenopathy.     Left: Normal. No mass, tenderness, axillary adenopathy or supraclavicular adenopathy.    Abdominal:     General: Abdomen is flat. Bowel sounds are normal. There is no distension.     Palpations: Abdomen is soft.     Tenderness: There is no abdominal tenderness.  Genitourinary:    Rectum: Guaiac result negative.  Musculoskeletal:        General: No swelling. Normal range of motion.     Cervical back: Full passive range of motion without pain, normal range of motion and neck supple.     Right lower leg: No edema.     Left lower leg: No edema.  Lymphadenopathy:     Upper Body:     Right upper body: No supraclavicular, axillary or pectoral adenopathy.     Left upper body: No supraclavicular, axillary or pectoral adenopathy.  Skin:    General: Skin is warm and dry.     Capillary Refill: Capillary refill takes less than 2 seconds.  Neurological:     General: No focal deficit present.     Mental Status: She is alert and oriented to person, place, and time.     Cranial Nerves: No cranial nerve deficit.     Sensory: No sensory deficit.  Psychiatric:        Mood and Affect: Mood normal.        Behavior: Behavior normal.        Thought Content: Thought content normal.        Judgment: Judgment normal.         Assessment And Plan:     1. Encounter for  general adult medical examination w/o abnormal findings . Behavior modifications discussed and diet history reviewed.   . Pt will continue to exercise regularly and modify diet with low GI, plant based foods and decrease intake of processed foods.  . Recommend intake of daily multivitamin, Vitamin D, and calcium.  . Recommend mammogram and colonoscopy (both are up to date) for preventive screenings, as well as recommend immunizations that include influenza, TDAP and shingles (Rx sent) - CMP14+EGFR -  CBC  2. Abnormal glucose  Chronic, stable  No current medications  Encouraged to limit intake of sugary foods and drinks  Encouraged to increase physical activity to 150 minutes per week - POCT Urinalysis Dipstick (81002) - POCT UA - Microalbumin - Hemoglobin A1c  3. Mixed hyperlipidemia  Chronic, controlled  no current medications, diet controlled - Lipid panel  4. Other insomnia  She is doing better with getting sleep  No additional medications  5. Anxiety  Continue with trintellix, she has been splitting the pill  I will try to order the 5 mg dose two tablets in the event she feels the dose is too much  6. Other fatigue  Will check metabolic causes - Vitamin B12 - TSH  7. S/P biliopancreatic diversion with duodenal switch  This was done approximately 5 years ago and she does not take any vitamins  Will check for anemia - Iron, TIBC and Ferritin Panel     Patient was given opportunity to ask questions. Patient verbalized understanding of the plan and was able to repeat key elements of the plan. All questions were answered to their satisfaction.   Minette Brine, FNP     I, Minette Brine, FNP, have reviewed all documentation for this visit. The documentation on 12/29/20 for the exam, diagnosis, procedures, and orders are all accurate and complete.   THE PATIENT IS ENCOURAGED TO PRACTICE SOCIAL DISTANCING DUE TO THE COVID-19 PANDEMIC.

## 2020-12-29 NOTE — Patient Instructions (Signed)

## 2020-12-30 ENCOUNTER — Other Ambulatory Visit: Payer: Self-pay | Admitting: Nurse Practitioner

## 2020-12-30 DIAGNOSIS — D508 Other iron deficiency anemias: Secondary | ICD-10-CM

## 2020-12-30 LAB — CBC
Hematocrit: 38 % (ref 34.0–46.6)
Hemoglobin: 12.2 g/dL (ref 11.1–15.9)
MCH: 26.9 pg (ref 26.6–33.0)
MCHC: 32.1 g/dL (ref 31.5–35.7)
MCV: 84 fL (ref 79–97)
Platelets: 340 10*3/uL (ref 150–450)
RBC: 4.54 x10E6/uL (ref 3.77–5.28)
RDW: 13.6 % (ref 11.7–15.4)
WBC: 7.9 10*3/uL (ref 3.4–10.8)

## 2020-12-30 LAB — IRON,TIBC AND FERRITIN PANEL
Ferritin: 14 ng/mL — ABNORMAL LOW (ref 15–150)
Iron Saturation: 13 % — ABNORMAL LOW (ref 15–55)
Iron: 56 ug/dL (ref 27–139)
Total Iron Binding Capacity: 422 ug/dL (ref 250–450)
UIBC: 366 ug/dL (ref 118–369)

## 2020-12-30 LAB — CMP14+EGFR
ALT: 13 IU/L (ref 0–32)
AST: 23 IU/L (ref 0–40)
Albumin/Globulin Ratio: 1.2 (ref 1.2–2.2)
Albumin: 4.1 g/dL (ref 3.8–4.8)
Alkaline Phosphatase: 162 IU/L — ABNORMAL HIGH (ref 44–121)
BUN/Creatinine Ratio: 15 (ref 12–28)
BUN: 14 mg/dL (ref 8–27)
Bilirubin Total: 0.3 mg/dL (ref 0.0–1.2)
CO2: 18 mmol/L — ABNORMAL LOW (ref 20–29)
Calcium: 8.9 mg/dL (ref 8.7–10.3)
Chloride: 108 mmol/L — ABNORMAL HIGH (ref 96–106)
Creatinine, Ser: 0.92 mg/dL (ref 0.57–1.00)
GFR calc Af Amer: 77 mL/min/{1.73_m2} (ref >59)
GFR calc non Af Amer: 67 mL/min/{1.73_m2} (ref >59)
Globulin, Total: 3.3 g/dL (ref 1.5–4.5)
Glucose: 72 mg/dL (ref 65–99)
Potassium: 4.7 mmol/L (ref 3.5–5.2)
Sodium: 144 mmol/L (ref 134–144)
Total Protein: 7.4 g/dL (ref 6.0–8.5)

## 2020-12-30 LAB — LIPID PANEL
Chol/HDL Ratio: 2.6 ratio (ref 0.0–4.4)
Cholesterol, Total: 166 mg/dL (ref 100–199)
HDL: 65 mg/dL (ref >39)
LDL Chol Calc (NIH): 92 mg/dL (ref 0–99)
Triglycerides: 42 mg/dL (ref 0–149)
VLDL Cholesterol Cal: 9 mg/dL (ref 5–40)

## 2020-12-30 LAB — HEMOGLOBIN A1C
Est. average glucose Bld gHb Est-mCnc: 105 mg/dL
Hgb A1c MFr Bld: 5.3 % (ref 4.8–5.6)

## 2020-12-30 LAB — VITAMIN B12: Vitamin B-12: 381 pg/mL (ref 232–1245)

## 2020-12-30 LAB — TSH: TSH: 1.95 u[IU]/mL (ref 0.450–4.500)

## 2020-12-30 MED ORDER — FERROUS SULFATE 325 (65 FE) MG PO TBEC: 325.0000 mg | DELAYED_RELEASE_TABLET | Freq: Every day | ORAL | 1 refills | Status: DC

## 2021-01-03 ENCOUNTER — Other Ambulatory Visit: Payer: Self-pay

## 2021-01-03 MED ORDER — VORTIOXETINE HBR 10 MG PO TABS: 10.0000 mg | ORAL_TABLET | Freq: Every day | ORAL | 2 refills | Status: DC

## 2021-07-12 ENCOUNTER — Other Ambulatory Visit: Payer: Self-pay

## 2021-07-12 MED ORDER — VORTIOXETINE HBR 10 MG PO TABS: 10.0000 mg | ORAL_TABLET | Freq: Every day | ORAL | 2 refills | Status: DC

## 2021-09-11 ENCOUNTER — Other Ambulatory Visit: Payer: Self-pay | Admitting: Nurse Practitioner

## 2021-09-11 DIAGNOSIS — Z1231 Encounter for screening mammogram for malignant neoplasm of breast: Secondary | ICD-10-CM

## 2021-09-12 ENCOUNTER — Other Ambulatory Visit: Payer: Self-pay

## 2021-09-12 ENCOUNTER — Ambulatory Visit
Admission: RE | Admit: 2021-09-12 | Discharge: 2021-09-12 | Disposition: A | Discharge status: Home / Self Care | Payer: BC Managed Care – PPO | Source: Ambulatory Visit | Attending: Nurse Practitioner | Admitting: Nurse Practitioner

## 2021-09-12 DIAGNOSIS — Z1211 Encounter for screening for malignant neoplasm of colon: Secondary | ICD-10-CM | POA: Insufficient documentation

## 2021-09-12 DIAGNOSIS — Z1231 Encounter for screening mammogram for malignant neoplasm of breast: Secondary | ICD-10-CM

## 2021-09-12 HISTORY — DX: Encounter for screening for malignant neoplasm of colon: Z12.11

## 2021-09-13 ENCOUNTER — Encounter: Payer: Self-pay | Admitting: Nurse Practitioner

## 2021-09-13 ENCOUNTER — Ambulatory Visit: Payer: BC Managed Care – PPO | Admitting: Nurse Practitioner

## 2021-09-13 ENCOUNTER — Other Ambulatory Visit: Payer: Self-pay

## 2021-09-13 VITALS — BP 108/62 | HR 66 | Temp 98.7°F | Resp 18 | Ht 67.3 in | Wt 192.0 lb

## 2021-09-13 DIAGNOSIS — Z6829 Body mass index (BMI) 29.0-29.9, adult: Secondary | ICD-10-CM

## 2021-09-13 DIAGNOSIS — Z7689 Persons encountering health services in other specified circumstances: Secondary | ICD-10-CM

## 2021-09-13 DIAGNOSIS — G4709 Other insomnia: Secondary | ICD-10-CM

## 2021-09-13 DIAGNOSIS — F419 Anxiety disorder, unspecified: Secondary | ICD-10-CM

## 2021-09-13 DIAGNOSIS — Z23 Encounter for immunization: Secondary | ICD-10-CM

## 2021-09-13 DIAGNOSIS — R790 Abnormal level of blood mineral: Secondary | ICD-10-CM | POA: Diagnosis not present

## 2021-09-13 DIAGNOSIS — F32A Depression, unspecified: Secondary | ICD-10-CM | POA: Diagnosis not present

## 2021-09-13 MED ORDER — VORTIOXETINE HBR 10 MG PO TABS: 15.0000 mg | ORAL_TABLET | Freq: Every day | ORAL | 1 refills | Status: DC

## 2021-09-13 NOTE — Progress Notes (Signed)
Subjective:    Patient ID: Valerie Stuart, female    DOB: July 31, 1956, 65 y.o.   MRN: [id number]  HPI: Valerie Stuart is a 65 y.o. female presenting for new patient visit to establish care.  Introduced to Designer, jewellery role and practice setting.  All questions answered.  Discussed provider/patient relationship and expectations.  Chief Complaint  Patient presents with   Establish Care   Weight Gain   ELEVATED BMI: 29 Duration: chronic Previous attempts at weight loss: yes; had weight loss surgery in 0998 Complications of obesity: anxiety/depression, previously had sleep apnea but this has resolved since surgery Peak weight: 280 lbs; today 192 lbs Weight loss goal: 170 Weight loss to date: 88 lbs Requesting obesity pharmacotherapy: no Current weight loss supplements/medications: no Water intake: "not enough "- about 32 oz daily; travels for work many days out of the week and does not have reliable bathroom Physical activity: walks around her property occasionally  History of sleep apnea -  reports she does not snore anymore since weight loss surgery.  However, her sleep is negatively affected by her husband who snores.  She wakes up every hour because of his snoring.  She has asked him to be evaluated for sleep apnea but he is not willing to wear a CPAP.  She has tried wearing ear buds and taking Melatonin to help her sleep.  She is planning to start sleeping in a separate room from her husband.  DEPRESSION Has been taking Trintellix for a number of years.  Does not know if its working as well as it used to.  Trintellix 20 mg made her feel "detached."  She wonders if a lot of her increased anxiety and positive scores on depression screen have to do with lack of consistent sleep. Mood status: exacerbated Satisfied with current treatment?: no Symptom severity: moderate  Duration of current treatment : years Side effects: no Medication compliance: excellent  compliance  Depression screen [facility] 2/9 09/14/2021 04/06/2020 11/02/2019 04/28/2019 04/28/2019  Decreased Interest 0 0 0 0 0  Down, Depressed, Hopeless 1 0 0 0 0  PHQ - 2 Score 1 0 0 0 0  Altered sleeping 2 2 0 - -  Tired, decreased energy 2 3 0 - -  Change in appetite 1 0 0 - -  Feeling bad or failure about yourself  0 1 0 - -  Trouble concentrating 0 3 0 - -  Moving slowly or fidgety/restless 0 0 0 - -  Suicidal thoughts 0 0 0 - -  PHQ-9 Score 6 9 0 - -  Difficult doing work/chores Not difficult at all Very difficult - - -    GAD 7 : Generalized Anxiety Score 09/14/2021 10/03/2018  Nervous, Anxious, on Edge 1 1  Control/stop worrying 0 1  Worry too much - different things 1 1  Trouble relaxing 1 1  Restless 0 0  Easily annoyed or irritable 1 1  Afraid - awful might happen 1 1  Total GAD 7 Score 5 6  Anxiety Difficulty Not difficult at all Not difficult at all    Allergies  Allergen Reactions   Bee Venom Anaphylaxis    Outpatient Encounter Medications as of 09/13/2021  Medication Sig   CALCIUM PO    Cyanocobalamin (VITAMIN B12 PO)    ferrous sulfate 325 (65 FE) MG EC tablet Take 1 tablet (325 mg total) by mouth daily with breakfast.   VITAMIN A PO    VITAMIN D PO  VITAMIN E PO    VITAMIN K PO    [DISCONTINUED] vortioxetine HBr (TRINTELLIX) 10 MG TABS tablet Take 1 tablet (10 mg total) by mouth daily.   vortioxetine HBr (TRINTELLIX) 10 MG TABS tablet Take 1.5 tablets (15 mg total) by mouth daily.   No facility-administered encounter medications on file as of 09/13/2021.    Active Ambulatory Problems    Diagnosis Date Noted   History of sleep apnea 04/20/2016   Insomnia 01/09/2019   Anxiety 01/09/2019   Depression 03/03/2019   BMI 29.0-29.9,adult 09/14/2021   Low ferritin 09/14/2021   Resolved Ambulatory Problems    Diagnosis Date Noted   Bradycardia, unspecified 06/06/2017   Fatigue 06/06/2017   Morbid obesity ([place]) 06/28/2016   Type 2 diabetes mellitus without  complication, without long-term current use of insulin ([place]) 03/20/2016   Colon cancer screening 09/12/2021   Past Medical History:  Diagnosis Date   Hyperlipidemia     Past Medical History:  Diagnosis Date   Bradycardia, unspecified 06/06/2017   Symptomatic bradycardia   Colon cancer screening 09/12/2021   Fatigue 06/06/2017   Hyperlipidemia     Past Surgical History:  Procedure Laterality Date   BREATH TEK H PYLORI N/A 04/22/2015   Procedure: BREATH TEK H PYLORI;  Surgeon: Greer Pickerel, MD;  Location: [facility];  Service: General;  Laterality: N/A;   CHOLECYSTECTOMY  2017   HERNIA REPAIR  2017   LAPAROSCOPIC GASTRIC RESTRICTIVE DUODENAL PROCEDURE (DUODENAL SWITCH)  2017   with bilio-pancreatic diversion   TONSILLECTOMY      Social History   Tobacco Use   Smoking status: Never   Smokeless tobacco: Never  Vaping Use   Vaping Use: Never used  Substance Use Topics   Alcohol use: No   Drug use: No    Family History  Problem Relation Age of Onset   Heart disease Mother    Hypertension Mother    Alzheimer's disease Father    Hypertension Brother     Review of Systems Per HPI unless specifically indicated above     Objective:    BP 108/62 (BP Location: Left Arm, Patient Position: Sitting)   Pulse 66   Temp 98.7 F (37.1 C) (Oral)   Resp 18   Ht 5' 7.3" (1.709 m)   Wt 192 lb (87.1 kg)   SpO2 98%   BMI 29.80 kg/m   Wt Readings from Last 3 Encounters:  09/13/21 192 lb (87.1 kg)  12/29/20 187 lb (84.8 kg)  05/30/20 179 lb 6.4 oz (81.4 kg)    Physical Exam Vitals and nursing note reviewed.  Constitutional:      General: She is not in acute distress.    Appearance: Normal appearance. She is not toxic-appearing.  HENT:     Head: Normocephalic and atraumatic.  Cardiovascular:     Rate and Rhythm: Normal rate and regular rhythm.     Heart sounds: Normal heart sounds. No murmur heard. Pulmonary:     Effort: Pulmonary effort is normal. No respiratory  distress.     Breath sounds: Normal breath sounds. No wheezing, rhonchi or rales.  Skin:    General: Skin is warm and dry.     Capillary Refill: Capillary refill takes less than 2 seconds.     Coloration: Skin is not jaundiced or pale.     Findings: No erythema.  Neurological:     Mental Status: She is alert and oriented to person, place, and time.     Motor: No weakness.  Gait: Gait normal.  Psychiatric:        Mood and Affect: Mood normal.        Behavior: Behavior normal.        Thought Content: Thought content normal.        Judgment: Judgment normal.       Assessment & Plan:   Problem List Items Addressed This Visit       Other   Low ferritin    Chronic.  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  Check CBC today with ferritin level and follow up pending results.       Relevant Orders   CBC with Differential (Completed)   Iron, TIBC and Ferritin Panel (Completed)   Insomnia    Chronic.  I suspect her husband snoring has a lot to do with her quality of and ability to have restful sleep.  We discussed options today including regular use of Melatonin and setting up a second bedroom that she can sleep in.  She is agreeable to try these tactics.  Follow up if they do not help.      Depression    Chronic.  PHQ-9 and GAD-7 are slightly elevated today, no SI/HI. Patient is interested in increasing Trintellix - will slowly increase to 15 mg daily.  She can take 1.5 tablets of what she currently has every other day for 1 week and then they to take 1.5 tablets daily thereafter.  She will let me know how this goes.  I sent in a refill today for her.      Relevant Medications   vortioxetine HBr (TRINTELLIX) 10 MG TABS tablet   BMI 29.0-29.9,adult    First, I congratulated patient on her great success with weight loss.  However, she is unhappy with her weight currently; we discussed dietary and lifestyle changes.  Increase physical activity slowly - goal is 30 minutes 5 times  weekly.  Discussed increasing water intake.  Also start weight bearing activity with resistance bands.  Lifestyle changes should be small changes that are sustainable for patient.  Weight loss goal is ~2 lbs per week at a maximum.  Follow up in 4 months for recheck and CPE.  May qualify for GLP-1a therapy in the future.        Anxiety    Chronic.  PHQ-9 and GAD-7 are slightly elevated today, no SI/HI. Patient is interested in increasing Trintellix - will slowly increase to 15 mg daily.  She can take 1.5 tablets of what she currently has every other day for 1 week and then they to take 1.5 tablets daily thereafter.  She will let me know how this goes.  I sent in a refill today for her.      Relevant Medications   vortioxetine HBr (TRINTELLIX) 10 MG TABS tablet   Other Visit Diagnoses     Encounter to establish care    -  Primary   Need for immunization against influenza       Relevant Orders   Flu Vaccine QUAD 51mo+IM (Fluarix, Fluzone & Alfiuria Quad PF) (Completed)        Follow up plan: Return for after 2/17 for CPE.

## 2021-09-14 ENCOUNTER — Encounter: Payer: Self-pay | Admitting: Nurse Practitioner

## 2021-09-14 DIAGNOSIS — Z6829 Body mass index (BMI) 29.0-29.9, adult: Secondary | ICD-10-CM | POA: Insufficient documentation

## 2021-09-14 DIAGNOSIS — R790 Abnormal level of blood mineral: Secondary | ICD-10-CM | POA: Insufficient documentation

## 2021-09-14 LAB — CBC WITH DIFFERENTIAL/PLATELET
Absolute Monocytes: 434 cells/uL (ref 200–950)
Basophils Absolute: 7 cells/uL (ref 0–200)
Basophils Relative: 0.1 %
Eosinophils Absolute: 322 cells/uL (ref 15–500)
Eosinophils Relative: 4.6 %
HCT: 37.7 % (ref 35.0–45.0)
Hemoglobin: 12.4 g/dL (ref 11.7–15.5)
Lymphs Abs: 1435 cells/uL (ref 850–3900)
MCH: 27.7 pg (ref 27.0–33.0)
MCHC: 32.9 g/dL (ref 32.0–36.0)
MCV: 84.2 fL (ref 80.0–100.0)
MPV: 9.6 fL (ref 7.5–12.5)
Monocytes Relative: 6.2 %
Neutro Abs: 4802 cells/uL (ref 1500–7800)
Neutrophils Relative %: 68.6 %
Platelets: 303 10*3/uL (ref 140–400)
RBC: 4.48 10*6/uL (ref 3.80–5.10)
RDW: 12.9 % (ref 11.0–15.0)
Total Lymphocyte: 20.5 %
WBC: 7 10*3/uL (ref 3.8–10.8)

## 2021-09-14 LAB — IRON,TIBC AND FERRITIN PANEL
%SAT: 23 % (calc) (ref 16–45)
Ferritin: 13 ng/mL — ABNORMAL LOW (ref 16–288)
Iron: 83 ug/dL (ref 45–160)
TIBC: 367 mcg/dL (calc) (ref 250–450)

## 2021-09-14 NOTE — Assessment & Plan Note (Signed)
Chronic.  PHQ-9 and GAD-7 are slightly elevated today, no SI/HI. Patient is interested in increasing Trintellix - will slowly increase to 15 mg daily.  She can take 1.5 tablets of what she currently has every other day for 1 week and then they to take 1.5 tablets daily thereafter.  She will let me know how this goes.  I sent in a refill today for her.

## 2021-09-14 NOTE — Assessment & Plan Note (Signed)
Chronic.  I suspect her husband snoring has a lot to do with her quality of and ability to have restful sleep.  We discussed options today including regular use of Melatonin and setting up a second bedroom that she can sleep in.  She is agreeable to try these tactics.  Follow up if they do not help.

## 2021-09-14 NOTE — Assessment & Plan Note (Signed)
First, I congratulated patient on her great success with weight loss.  However, she is unhappy with her weight currently; we discussed dietary and lifestyle changes.  Increase physical activity slowly - goal is 30 minutes 5 times weekly.  Discussed increasing water intake.  Also start weight bearing activity with resistance bands.  Lifestyle changes should be small changes that are sustainable for patient.  Weight loss goal is ~2 lbs per week at a maximum.  Follow up in 4 months for recheck and CPE.  May qualify for GLP-1a therapy in the future.

## 2021-09-14 NOTE — Assessment & Plan Note (Signed)
Chronic.  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  Check CBC today with ferritin level and follow up pending results.

## 2021-11-21 IMAGING — MG MM DIGITAL SCREENING BILAT W/ TOMO AND CAD
8 series · 8 of 24 positions shown · non-contrast
Comparison: Previous exam(s).

ACR Breast Density Category a: The breast tissue is almost entirely
fatty.

CLINICAL DATA: Screening.

EXAM:
DIGITAL SCREENING BILATERAL MAMMOGRAM WITH TOMOSYNTHESIS AND CAD
TECHNIQUE: Bilateral screening digital craniocaudal and mediolateral oblique
mammograms were obtained. Bilateral screening digital breast
tomosynthesis was performed. The images were evaluated with
computer-aided detection.

[R MLO synth-2D]
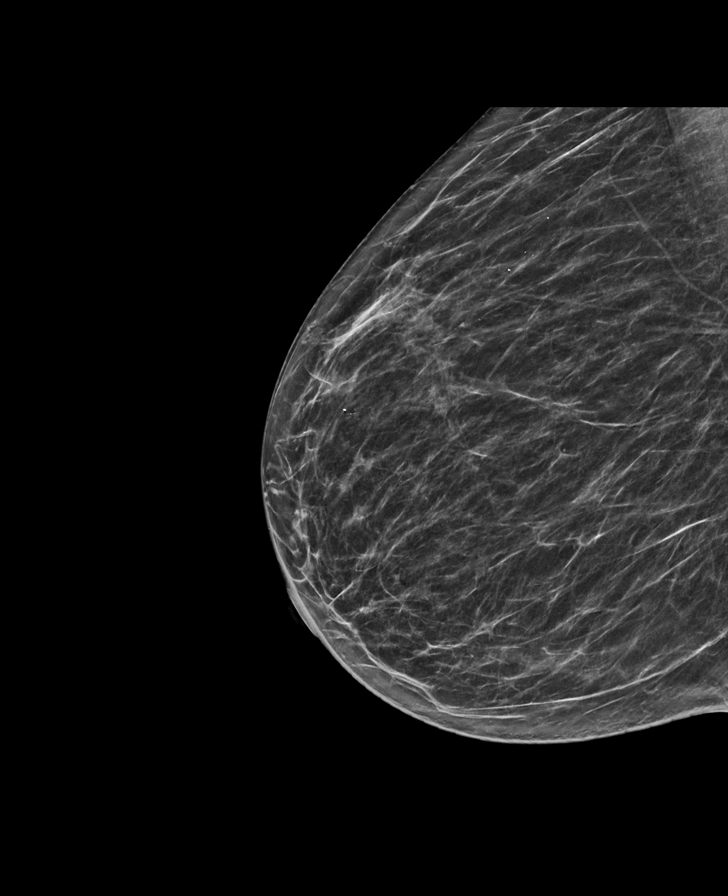

[R CC synth-2D]
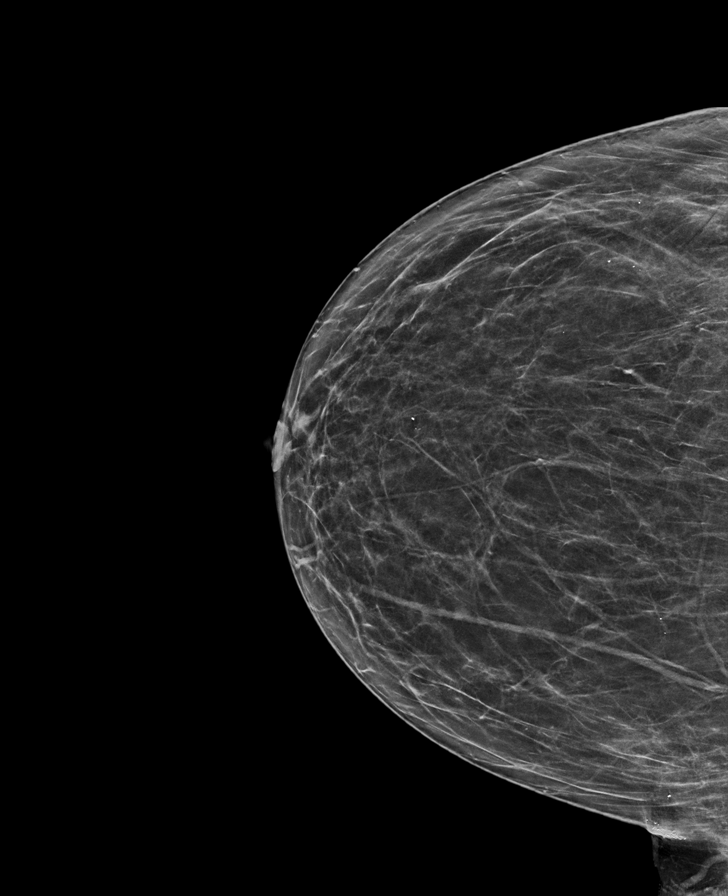

[L CC synth-2D]
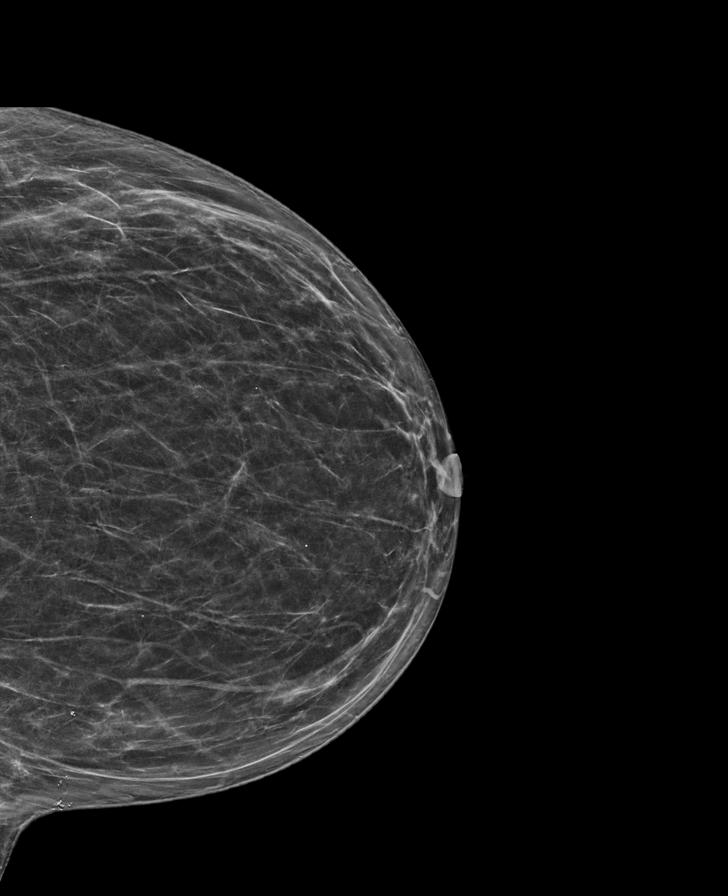

[L MLO synth-2D]
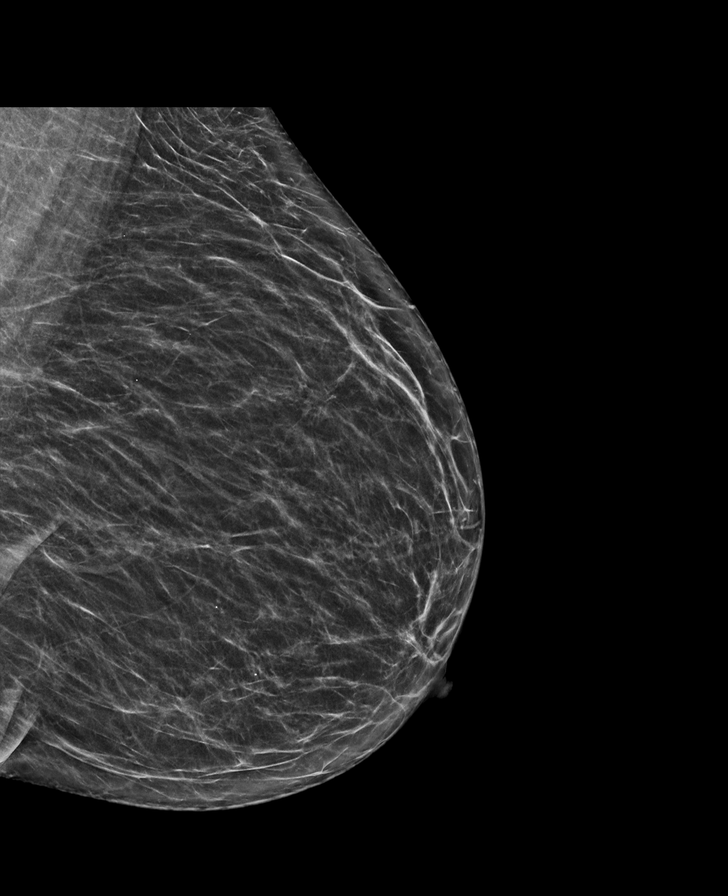

[R CC tomo · tomo slice 33/64.0]
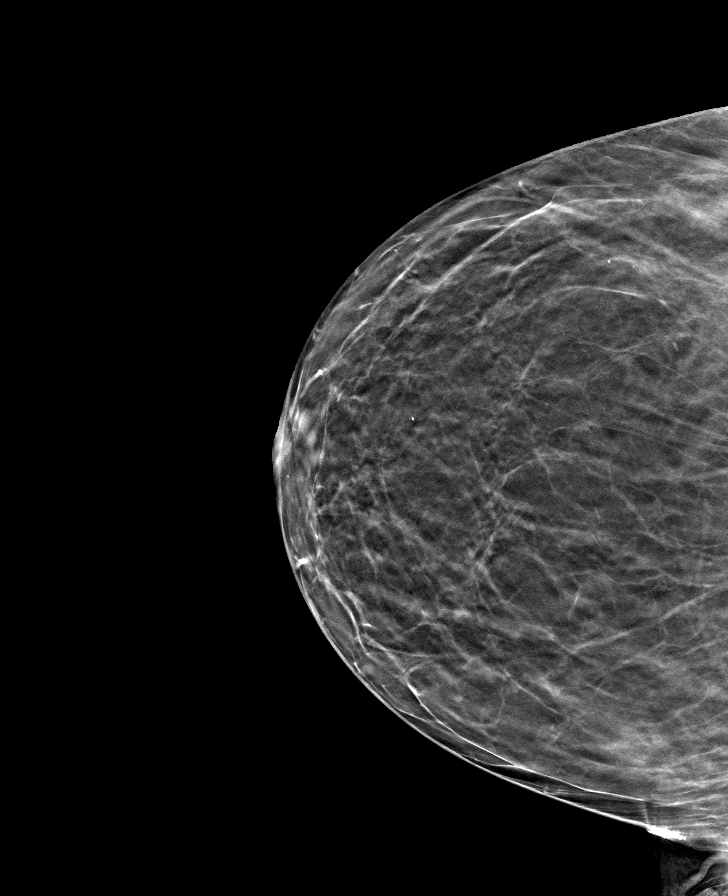

[R MLO tomo · tomo slice 33/64.0]
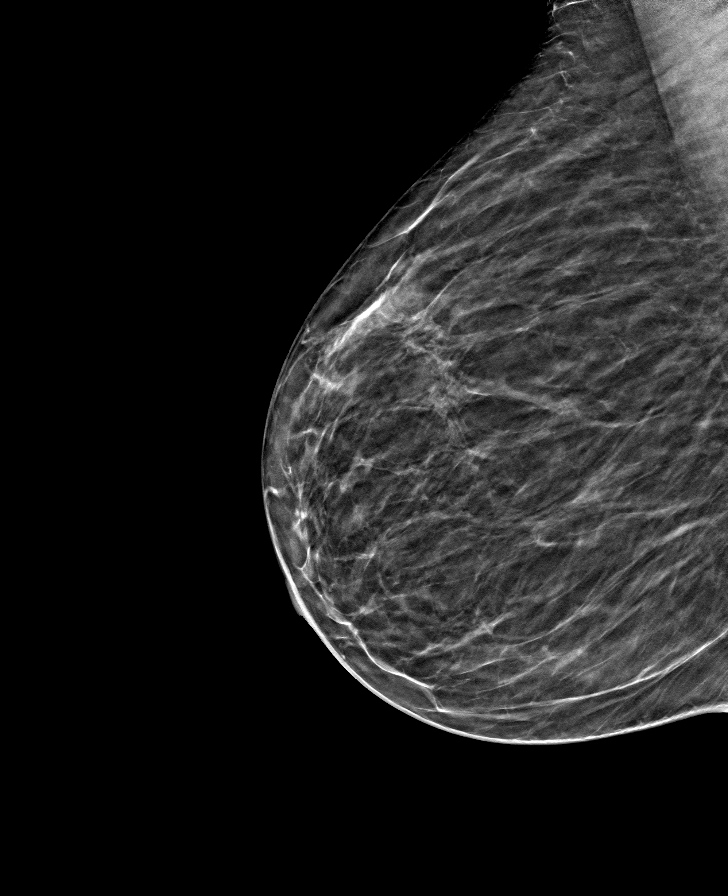

[L MLO tomo · tomo slice 33/64.0]
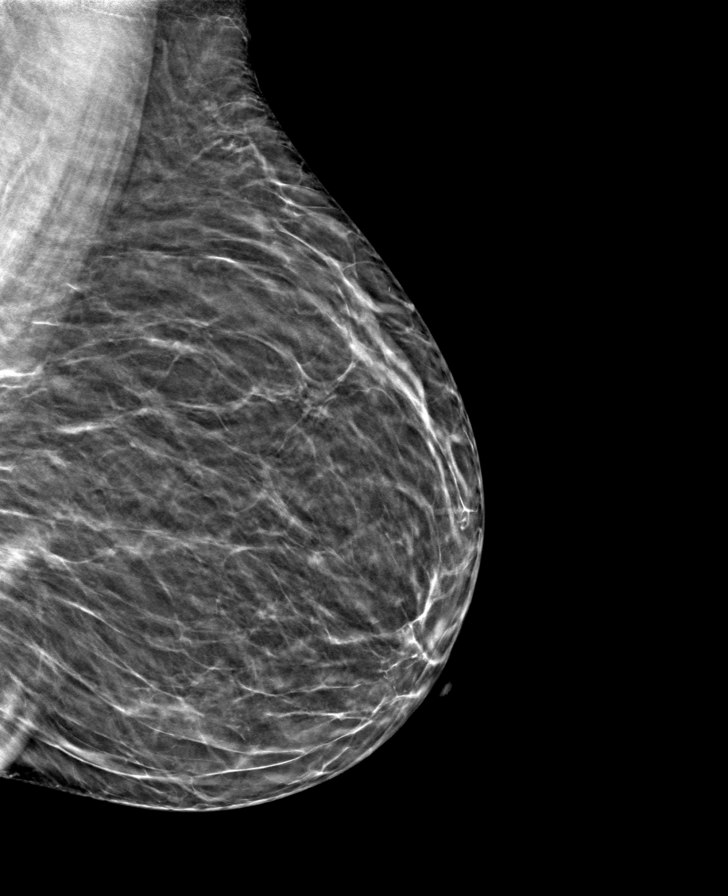

[L CC tomo · tomo slice 33/66.0]
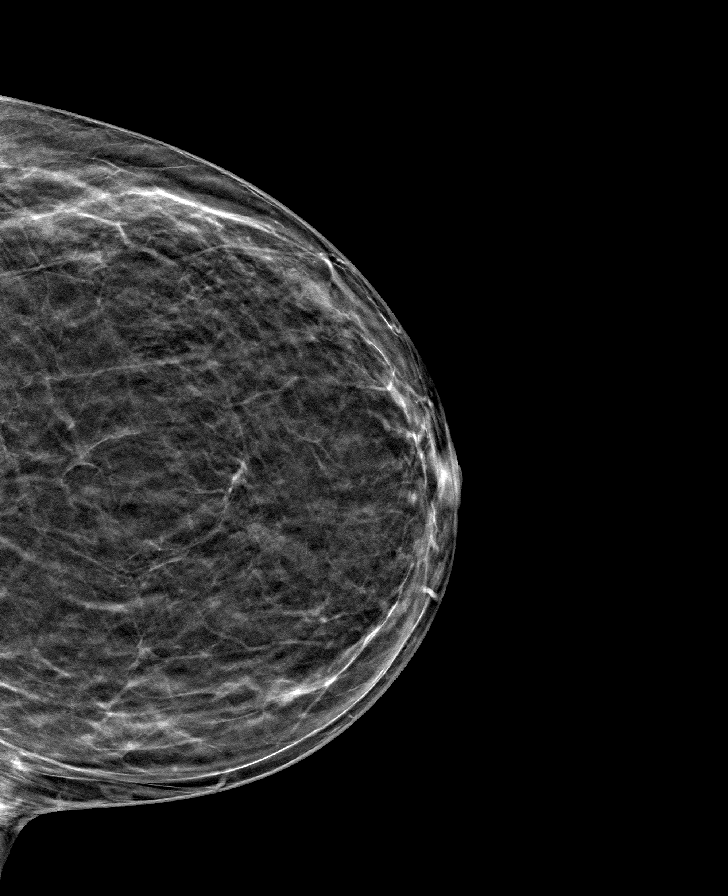

[8 of 24 positions shown; findings below may reference images not displayed]

FINDINGS: There are no findings suspicious for malignancy.
IMPRESSION: No mammographic evidence of malignancy. A result letter of this
screening mammogram will be mailed directly to the patient.

RECOMMENDATION:
Screening mammogram in one year. (Code:0E-3-N98)

BI-RADS CATEGORY  1: Negative.

## 2021-12-08 ENCOUNTER — Other Ambulatory Visit: Payer: Self-pay

## 2021-12-08 ENCOUNTER — Encounter: Payer: Self-pay | Admitting: Nurse Practitioner

## 2021-12-08 ENCOUNTER — Ambulatory Visit: Payer: BC Managed Care – PPO | Admitting: Nurse Practitioner

## 2021-12-08 VITALS — BP 110/62 | HR 80 | Ht 67.0 in | Wt 194.0 lb

## 2021-12-08 DIAGNOSIS — F419 Anxiety disorder, unspecified: Secondary | ICD-10-CM

## 2021-12-08 DIAGNOSIS — F32A Depression, unspecified: Secondary | ICD-10-CM | POA: Diagnosis not present

## 2021-12-08 MED ORDER — BUPROPION HCL ER (SR) 100 MG PO TB12
100.0000 mg | ORAL_TABLET | Freq: Two times a day (BID) | ORAL | 1 refills | Status: DC
Start: 2021-12-08 — End: 2022-02-14

## 2021-12-08 MED ORDER — VORTIOXETINE HBR 5 MG PO TABS: 5.0000 mg | ORAL_TABLET | Freq: Every day | ORAL | 0 refills | Status: DC

## 2021-12-08 NOTE — Assessment & Plan Note (Signed)
Chronic.  PHQ-9 and GAD-7 are slightly elevated today from previous reading.  Will plan to slowly decrease Trintellix to 10 mg for the next 7 days.  Then, decrease to Trintellix 5 mg daily.  Start bupropion 100 mg twice daily when Trintellix is decreased to 5 mg daily.  We also discussed mood stabilizer like Lamictal, however want to limit weight gain.  We discussed the side effects including serotonin syndrome and seizure and she should seek emergent care with these side effects.  I encouraged patient to follow up with new PCP within the next month to adjust medication as needed.  Continue collaboration with Psychology as well.

## 2021-12-08 NOTE — Progress Notes (Signed)
Subjective:    Patient ID: Valerie Stuart, female    DOB: [birth year], 66 y.o.   MRN: [id number]  HPI: Valerie Stuart is a 65 y.o. female presenting for follow up.  Chief Complaint  Patient presents with   Depression   ANXIETY/DEPRESSION She is wondering if the Trintellix is working as much as it used to.  She is interested in other medications.   She has been talking to Therapist - Velva Harman and plans to increase the frequency of their conversations for the next couple of months. Reports she has become a lot more anxious and fixated on things, she is constantly trying to have her "ducks in a row".    Depression screen [facility] 2/9 12/08/2021 09/14/2021 04/06/2020 11/02/2019 04/28/2019  Decreased Interest 1 0 0 0 0  Down, Depressed, Hopeless 1 1 0 0 0  PHQ - 2 Score 2 1 0 0 0  Altered sleeping 3 2 2  0 -  Tired, decreased energy 2 2 3  0 -  Change in appetite 2 1 0 0 -  Feeling bad or failure about yourself  0 0 1 0 -  Trouble concentrating 0 0 3 0 -  Moving slowly or fidgety/restless 0 0 0 0 -  Suicidal thoughts 0 0 0 0 -  PHQ-9 Score 9 6 9  0 -  Difficult doing work/chores Somewhat difficult Not difficult at all Very difficult - -    GAD 7 : Generalized Anxiety Score 12/08/2021 09/14/2021 10/03/2018  Nervous, Anxious, on Edge 1 1 1   Control/stop worrying 1 0 1  Worry too much - different things 1 1 1   Trouble relaxing 1 1 1   Restless 0 0 0  Easily annoyed or irritable 1 1 1   Afraid - awful might happen 1 1 1   Total GAD 7 Score 6 5 6   Anxiety Difficulty Somewhat difficult Not difficult at all Not difficult at all      Allergies  Allergen Reactions   Bee Venom Anaphylaxis    Outpatient Encounter Medications as of 12/08/2021  Medication Sig   buPROPion ER (WELLBUTRIN SR) 100 MG 12 hr tablet Take 1 tablet (100 mg total) by mouth 2 (two) times daily.   CALCIUM PO    Cyanocobalamin (VITAMIN B12 PO)    ferrous sulfate 325 (65 FE) MG EC tablet Take 1 tablet (325 mg total)  by mouth daily with breakfast.   VITAMIN A PO    VITAMIN D PO    VITAMIN E PO    VITAMIN K PO    [DISCONTINUED] vortioxetine HBr (TRINTELLIX) 10 MG TABS tablet Take 1.5 tablets (15 mg total) by mouth daily.   vortioxetine HBr (TRINTELLIX) 5 MG TABS tablet Take 1 tablet (5 mg total) by mouth daily.   No facility-administered encounter medications on file as of 12/08/2021.    Patient Active Problem List   Diagnosis Date Noted   BMI 29.0-29.9,adult 09/14/2021   Low ferritin 09/14/2021   Depression 03/03/2019   Insomnia 01/09/2019   Anxiety 01/09/2019   History of sleep apnea 04/20/2016    Past Medical History:  Diagnosis Date   Bradycardia, unspecified 06/06/2017   Symptomatic bradycardia   Colon cancer screening 09/12/2021   Fatigue 06/06/2017   Hyperlipidemia     Relevant past medical, surgical, family and social history reviewed and updated as indicated. Interim medical history since our last visit reviewed.  Review of Systems Per HPI unless specifically indicated above     Objective:  BP 110/62    Pulse 80    Ht 5\' 7"  (1.702 m)    Wt 194 lb (88 kg)    SpO2 99%    BMI 30.38 kg/m   Wt Readings from Last 3 Encounters:  12/08/21 194 lb (88 kg)  09/13/21 192 lb (87.1 kg)  12/29/20 187 lb (84.8 kg)    Physical Exam Vitals and nursing note reviewed.  Constitutional:      General: She is not in acute distress.    Appearance: Normal appearance. She is not toxic-appearing.  HENT:     Head: Normocephalic and atraumatic.  Skin:    General: Skin is warm and dry.     Coloration: Skin is not jaundiced or pale.     Findings: No erythema.  Neurological:     Mental Status: She is alert and oriented to person, place, and time.     Motor: No weakness.     Gait: Gait normal.  Psychiatric:        Mood and Affect: Mood normal.        Behavior: Behavior normal.        Thought Content: Thought content normal.        Judgment: Judgment normal.       Assessment & Plan:    Problem List Items Addressed This Visit       Other   Depression    Chronic.  PHQ-9 and GAD-7 are slightly elevated today from previous reading.  Will plan to slowly decrease Trintellix to 10 mg for the next 7 days.  Then, decrease to Trintellix 5 mg daily.  Start bupropion 100 mg twice daily when Trintellix is decreased to 5 mg daily.  We also discussed mood stabilizer like Lamictal, however want to limit weight gain.  We discussed the side effects including serotonin syndrome and seizure and she should seek emergent care with these side effects.  I encouraged patient to follow up with new PCP within the next month to adjust medication as needed.  Continue collaboration with Psychology as well.       Relevant Medications   buPROPion ER (WELLBUTRIN SR) 100 MG 12 hr tablet   vortioxetine HBr (TRINTELLIX) 5 MG TABS tablet   Anxiety - Primary   Relevant Medications   buPROPion ER (WELLBUTRIN SR) 100 MG 12 hr tablet   vortioxetine HBr (TRINTELLIX) 5 MG TABS tablet     Follow up plan: No follow-ups on file.

## 2021-12-08 NOTE — Patient Instructions (Addendum)
Decrease Trintellix to 10 mg daily starting today for 7 days, then 5 mg daily thereafter.  Start bupropion 100 mg twice daily upon decreasing to 5 mg dosing of Trintellix.

## 2022-01-01 ENCOUNTER — Encounter: Payer: BC Managed Care – PPO | Admitting: Nurse Practitioner

## 2022-02-14 ENCOUNTER — Other Ambulatory Visit: Payer: Self-pay

## 2022-02-14 DIAGNOSIS — F419 Anxiety disorder, unspecified: Secondary | ICD-10-CM

## 2022-02-14 DIAGNOSIS — F32A Depression, unspecified: Secondary | ICD-10-CM

## 2022-02-14 MED ORDER — BUPROPION HCL ER (SR) 100 MG PO TB12: 100.0000 mg | ORAL_TABLET | Freq: Two times a day (BID) | ORAL | 1 refills | Status: DC

## 2022-03-12 ENCOUNTER — Encounter: Payer: Self-pay | Admitting: Family Medicine

## 2022-03-12 ENCOUNTER — Ambulatory Visit: Payer: BC Managed Care – PPO | Admitting: Family Medicine

## 2022-03-12 VITALS — BP 102/70 | HR 68 | Temp 98.2°F | Ht 67.0 in | Wt 187.0 lb

## 2022-03-12 DIAGNOSIS — F32A Depression, unspecified: Secondary | ICD-10-CM

## 2022-03-12 DIAGNOSIS — Z13220 Encounter for screening for lipoid disorders: Secondary | ICD-10-CM

## 2022-03-12 DIAGNOSIS — Z7689 Persons encountering health services in other specified circumstances: Secondary | ICD-10-CM

## 2022-03-12 MED ORDER — BUPROPION HCL ER (XL) 300 MG PO TB24: 300.0000 mg | ORAL_TABLET | Freq: Every day | ORAL | 3 refills | Status: DC

## 2022-03-12 NOTE — Progress Notes (Signed)
? ?Subjective:  ? ? Patient ID: Valerie Stuart, female    DOB: 1956-07-20, 66 y.o.   MRN: [id number] ? ?HPI ?Patient is a very pleasant 66 year old African-American female who is here today to establish care with me.  She has a history of anxiety and depression.  At her last visit, her previous PCP wean down her Trintellix to 5 mg a day due to the patient feeling disconnected.  They also started her on bupropion 100 mg twice a day.  She states that she feels like she is doing better.  She denies any side effects from the bupropion.  She is uncertain when her last colonoscopy is.  Her mammogram is not due until November.  She is also due for a Pap smear.  She prefers to see a female provider for her Pap smear.  She would like to schedule this.  She would like to check on her last colonoscopy prior to me scheduling anything for her. ?Past Medical History:  ?Diagnosis Date  ? Anxiety   ? Bradycardia, unspecified 06/06/2017  ? Symptomatic bradycardia  ? Colon cancer screening 09/12/2021  ? Depression   ? Fatigue 06/06/2017  ? Hyperlipidemia   ? ? ?Past Surgical History:  ?Procedure Laterality Date  ? BREATH TEK H PYLORI N/A 04/22/2015  ? Procedure: BREATH TEK H PYLORI;  Surgeon: Greer Pickerel, MD;  Location: [facility];  Service: General;  Laterality: N/A;  ? CHOLECYSTECTOMY  2017  ? HERNIA REPAIR  2017  ? 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 with bilio-pancreatic diversion  ? TONSILLECTOMY    ? ?Current Outpatient Medications on File Prior to Visit  ?Medication Sig Dispense Refill  ? buPROPion ER (WELLBUTRIN SR) 100 MG 12 hr tablet Take 1 tablet (100 mg total) by mouth 2 (two) times daily. 60 tablet 1  ? CALCIUM PO     ? Cyanocobalamin (VITAMIN B12 PO)     ? ferrous sulfate 325 (65 FE) MG EC tablet Take 1 tablet (325 mg total) by mouth daily with breakfast. 90 tablet 1  ? VITAMIN A PO     ? VITAMIN D PO     ? VITAMIN E PO     ? VITAMIN K PO     ? vortioxetine HBr (TRINTELLIX)  5 MG TABS tablet Take 1 tablet (5 mg total) by mouth daily. 90 tablet 0  ? ?No current facility-administered medications on file prior to visit.  ? ?Allergies  ?Allergen Reactions  ? Bee Venom Anaphylaxis  ? ?Social History  ? ?Socioeconomic History  ? Marital status: Married  ?  Spouse name: Not on file  ? Number of children: Not on file  ? Years of education: Not on file  ? Highest education level: Not on file  ?Occupational History  ? Occupation: Checks group homes for compliance  ?  Employer: [employer]  ?Tobacco Use  ? Smoking status: Never  ? Smokeless tobacco: Never  ?Vaping Use  ? Vaping Use: Never used  ?Substance and Sexual Activity  ? Alcohol use: No  ? Drug use: No  ? Sexual activity: Yes  ?Other Topics Concern  ? Not on file  ?Social History Narrative  ? Not on file  ? ?Social Determinants of Health  ? ?Financial Resource Strain: Not on file  ?Food Insecurity: Not on file  ?Transportation Needs: Not on file  ?Physical Activity: Not on file  ?Stress: Not on file  ?Social Connections: Not on file  ?  Intimate Partner Violence: Not on file  ? ? ? ?Review of Systems  ?All other systems reviewed and are negative. ? ?   ?Objective:  ? Physical Exam ?Vitals reviewed.  ?Constitutional:   ?   Appearance: Normal appearance. She is normal weight.  ?Cardiovascular:  ?   Rate and Rhythm: Normal rate and regular rhythm.  ?   Heart sounds: Normal heart sounds. No murmur heard. ?  No friction rub. No gallop.  ?Pulmonary:  ?   Effort: Pulmonary effort is normal. No respiratory distress.  ?   Breath sounds: Normal breath sounds. No stridor. No wheezing or rales.  ?Musculoskeletal:  ?   Right lower leg: No edema.  ?   Left lower leg: No edema.  ?Neurological:  ?   General: No focal deficit present.  ?   Mental Status: She is alert and oriented to person, place, and time. Mental status is at baseline.  ?Psychiatric:     ?   Mood and Affect: Mood normal.     ?   Behavior: Behavior normal.     ?   Thought Content:  Thought content normal.  ? ? ? ? ? ?   ?Assessment & Plan:  ?Depression, unspecified depression type ? ?Encounter to establish care ? ?Screening cholesterol level - Plan: CBC with Differential/Platelet, Lipid panel, COMPLETE METABOLIC PANEL WITH GFR ?I would like to switch the patient's bupropion to Wellbutrin XL 300 mg a day to try to make it more convenient for her to hopefully see improvement in her symptom control.  If she continues to do better, we may discontinue Trintellix altogether.  Her mammogram is up-to-date.  I recommended a Pap smear and colonoscopy.  Patient will schedule the Pap smear with her gynecologist as she prefers a female provider.  She will check on the date of her colonoscopy and let me know when she is due next as I do not have any previous records for me to review.  Check CBC CMP and a lipid panel today. ? ?

## 2022-03-13 LAB — CBC WITH DIFFERENTIAL/PLATELET
Absolute Monocytes: 421 cells/uL (ref 200–950)
Basophils Absolute: 12 cells/uL (ref 0–200)
Basophils Relative: 0.2 %
Eosinophils Absolute: 281 cells/uL (ref 15–500)
Eosinophils Relative: 4.6 %
HCT: 37.3 % (ref 35.0–45.0)
Hemoglobin: 12 g/dL (ref 11.7–15.5)
Lymphs Abs: 1330 cells/uL (ref 850–3900)
MCH: 27.6 pg (ref 27.0–33.0)
MCHC: 32.2 g/dL (ref 32.0–36.0)
MCV: 85.9 fL (ref 80.0–100.0)
MPV: 9.9 fL (ref 7.5–12.5)
Monocytes Relative: 6.9 %
Neutro Abs: 4057 cells/uL (ref 1500–7800)
Neutrophils Relative %: 66.5 %
Platelets: 285 10*3/uL (ref 140–400)
RBC: 4.34 10*6/uL (ref 3.80–5.10)
RDW: 13.1 % (ref 11.0–15.0)
Total Lymphocyte: 21.8 %
WBC: 6.1 10*3/uL (ref 3.8–10.8)

## 2022-03-13 LAB — LIPID PANEL
Cholesterol: 148 mg/dL (ref <200)
HDL: 57 mg/dL (ref >=50)
LDL Cholesterol (Calc): 78 mg/dL (calc)
Non-HDL Cholesterol (Calc): 91 mg/dL (calc) (ref <130)
Total CHOL/HDL Ratio: 2.6 (calc) (ref <5.0)
Triglycerides: 44 mg/dL (ref <150)

## 2022-03-13 LAB — COMPLETE METABOLIC PANEL WITH GFR
AG Ratio: 1.3 (calc) (ref 1.0–2.5)
ALT: 8 U/L (ref 6–29)
AST: 17 U/L (ref 10–35)
Albumin: 3.8 g/dL (ref 3.6–5.1)
Alkaline phosphatase (APISO): 144 U/L (ref 37–153)
BUN: 12 mg/dL (ref 7–25)
CO2: 26 mmol/L (ref 20–32)
Calcium: 8.5 mg/dL — ABNORMAL LOW (ref 8.6–10.4)
Chloride: 110 mmol/L (ref 98–110)
Creat: 1.01 mg/dL (ref 0.50–1.05)
Globulin: 3 g/dL (calc) (ref 1.9–3.7)
Glucose, Bld: 75 mg/dL (ref 65–99)
Potassium: 4 mmol/L (ref 3.5–5.3)
Sodium: 143 mmol/L (ref 135–146)
Total Bilirubin: 0.4 mg/dL (ref 0.2–1.2)
Total Protein: 6.8 g/dL (ref 6.1–8.1)
eGFR: 63 mL/min/{1.73_m2} (ref >=60)

## 2022-03-16 ENCOUNTER — Other Ambulatory Visit: Payer: Self-pay | Admitting: Nurse Practitioner

## 2022-03-16 DIAGNOSIS — F419 Anxiety disorder, unspecified: Secondary | ICD-10-CM

## 2022-03-16 DIAGNOSIS — F32A Depression, unspecified: Secondary | ICD-10-CM

## 2022-03-16 NOTE — Telephone Encounter (Signed)
Requested Prescriptions  ?Pending Prescriptions Disp Refills  ?? TRINTELLIX 5 MG TABS tablet [Pharmacy Med Name: Trintellix 5 MG Oral Tablet] 90 tablet 0  ?  Sig: Take 1 tablet by mouth once daily  ?  ? Psychiatry: Antidepressants - Serotonin Modulator Passed - 03/16/2022  7:09 AM  ?  ?  Passed - Completed PHQ-2 or PHQ-9 in the last 360 days  ?  ?  Passed - Valid encounter within last 6 months  ?  Recent Outpatient Visits   ?      ? 4 days ago Depression, unspecified depression type  ? [facility] [place], Cammie Mcgee, MD  ? 3 months ago Anxiety  ? [place], NP  ? 6 months ago Encounter to establish care  ? [place] Eulogio Bear, NP  ?  ?  ? ?  ?  ?  ? ? ?

## 2022-03-19 ENCOUNTER — Telehealth: Payer: Self-pay | Admitting: Family Medicine

## 2022-03-19 NOTE — Telephone Encounter (Signed)
Left VM to CB regarding symptoms/headache and med change request. ?

## 2022-03-19 NOTE — Telephone Encounter (Signed)
Patient has been having headaches since her medication wellbutrin was changed. She would like to know if she can go back to the way she was taken it. ? ?CB# [phone] ?

## 2022-03-20 MED ORDER — BUPROPION HCL ER (SR) 100 MG PO TB12: 100.0000 mg | ORAL_TABLET | Freq: Two times a day (BID) | ORAL | 3 refills | Status: DC

## 2022-03-20 NOTE — Telephone Encounter (Signed)
Attempted to call patient, no answer and vm is full ? ?Rx changed and sent to pharmacy.  ?

## 2022-03-20 NOTE — Telephone Encounter (Signed)
Attempted to call patient with office response- left message to call office back. ?

## 2022-03-20 NOTE — Telephone Encounter (Signed)
Please advise if ok to back dose down to previous, as patient is reporting headaches with increased dose of Wellbutrin. ? ?Thanks! ?

## 2022-03-21 NOTE — Telephone Encounter (Signed)
Spoke with patient and advised. She is aware updated rx has been sent to pharmacy. Nothing further needed at this time.  ? ?

## 2022-07-15 ENCOUNTER — Ambulatory Visit: Payer: BC Managed Care – PPO

## 2022-07-17 ENCOUNTER — Ambulatory Visit: Payer: BC Managed Care – PPO | Admitting: Family Medicine

## 2022-07-17 VITALS — BP 112/62 | HR 77 | Temp 99.1°F | Ht 67.0 in | Wt 184.4 lb

## 2022-07-17 DIAGNOSIS — J069 Acute upper respiratory infection, unspecified: Secondary | ICD-10-CM

## 2022-07-17 NOTE — Progress Notes (Deleted)
   Subjective:    Patient ID: Valerie Stuart, female    DOB: 1956-10-25, 66 y.o.   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 60 tablet 3   CALCIUM PO      Cyanocobalamin (VITAMIN B12 PO)      ferrous sulfate 325 (65 FE) MG EC tablet Take 1 tablet (325 mg total) by mouth daily with breakfast. 90 tablet 1   TRINTELLIX 5 MG TABS tablet Take 1 tablet by mouth once daily 90 tablet 1   VITAMIN A PO      VITAMIN D PO      VITAMIN E PO      VITAMIN K PO      No current facility-administered medications on file prior to visit.   Allergies  Allergen Reactions   Bee Venom Anaphylaxis      Review of Systems     Objective:   Physical Exam Cardiovascular:     Rate and Rhythm: Normal rate and regular rhythm.     Heart sounds: No murmur heard. Musculoskeletal:     Cervical back: Neck supple.           Assessment & Plan:

## 2022-07-17 NOTE — Progress Notes (Signed)
Subjective:    Patient ID: Valerie Stuart, female    DOB: 1956/04/19, 66 y.o.   MRN: [id number]  HPI  Valerie Stuart presents today with complaints of improving sore throat, congestion, runny nose, and cough since Friday. Denies fatigue, fever, chills, night sweats, shortness of breath. She was exposed to a sick friend Thursday, has had 3 negative COVID tests (Sat, Sun, Tues). She has been taking Xyzal with an improvement in symptoms.    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 60 tablet 3   CALCIUM PO      Cyanocobalamin (VITAMIN B12 PO)      ferrous sulfate 325 (65 FE) MG EC tablet Take 1 tablet (325 mg total) by mouth daily with breakfast. 90 tablet 1   TRINTELLIX 5 MG TABS tablet Take 1 tablet by mouth once daily 90 tablet 1   VITAMIN A PO      VITAMIN D PO      VITAMIN E PO      VITAMIN K PO      No current facility-administered medications on file prior to visit.   Allergies  Allergen Reactions   Bee Venom Anaphylaxis      Review of Systems  Constitutional:  Negative for chills, fatigue and fever.  HENT:  Positive for congestion and rhinorrhea. Negative for sinus pressure, sinus pain and sore throat.   Respiratory:  Positive  for cough. Negative for shortness of breath.   All other systems reviewed and are negative.      Objective:   Physical Exam Vitals and nursing note reviewed.  Constitutional:      General: She is not in acute distress.    Appearance: Normal appearance. She is normal weight.  HENT:     Head: Normocephalic and atraumatic.     Right Ear: Tympanic membrane, ear canal and external ear normal.     Left Ear: Tympanic membrane, ear canal and external ear normal.     Nose: Nose normal.     Mouth/Throat:     Mouth: Mucous membranes are moist.     Pharynx: Oropharynx is clear.  Eyes:     Conjunctiva/sclera: Conjunctivae normal.     Pupils: Pupils are equal, round, and reactive to light.  Cardiovascular:     Rate and Rhythm: Normal rate and regular rhythm.  Pulmonary:     Effort: Pulmonary effort is normal.     Breath sounds: Normal breath sounds.  Neurological:     Mental Status: She is alert.        Assessment & Plan:   Viral upper respiratory tract infection with cough  Given her symptoms have been improving and she is overall  feeling better I would recommend symptomatic management with Sudafed D or Afrin OTC. She should return to office if symptoms persist or worsen.

## 2022-07-18 DIAGNOSIS — E785 Hyperlipidemia, unspecified: Secondary | ICD-10-CM | POA: Insufficient documentation

## 2022-07-29 DIAGNOSIS — B977 Papillomavirus as the cause of diseases classified elsewhere: Secondary | ICD-10-CM | POA: Insufficient documentation

## 2022-08-20 ENCOUNTER — Encounter: Payer: Self-pay | Admitting: Family Medicine

## 2022-09-20 LAB — HM MAMMOGRAPHY

## 2022-09-22 ENCOUNTER — Other Ambulatory Visit: Payer: Self-pay | Admitting: Family Medicine

## 2022-09-22 DIAGNOSIS — F32A Depression, unspecified: Secondary | ICD-10-CM

## 2022-09-22 DIAGNOSIS — F419 Anxiety disorder, unspecified: Secondary | ICD-10-CM

## 2022-09-24 NOTE — Telephone Encounter (Signed)
Requested Prescriptions  Pending Prescriptions Disp Refills   TRINTELLIX 5 MG TABS tablet [Pharmacy Med Name: Trintellix 5 MG Oral Tablet] 90 tablet 1    Sig: Take 1 tablet by mouth once daily     Psychiatry: Antidepressants - Serotonin Modulator Failed - 09/22/2022  1:03 PM      Failed - Valid encounter within last 6 months    Recent Outpatient Visits           6 months ago Depression, unspecified depression type   [place] Susy Frizzle, MD   9 months ago [place], Jessica A, NP   1 year ago Encounter to establish care   [place], NP              Passed - Completed PHQ-2 or PHQ-9 in the last 360 days

## 2022-11-21 ENCOUNTER — Other Ambulatory Visit: Payer: Self-pay | Admitting: Family Medicine

## 2022-11-21 NOTE — Telephone Encounter (Signed)
Requested Prescriptions  Pending Prescriptions Disp Refills   buPROPion ER (WELLBUTRIN SR) 100 MG 12 hr tablet [Pharmacy Med Name: buPROPion HCl ER (SR) 100 MG Oral Tablet Extended Release 12 Hour] 180 tablet 0    Sig: Take 1 tablet by mouth twice daily     Psychiatry: Antidepressants - bupropion Failed - 11/21/2022  9:54 AM      Failed - Valid encounter within last 6 months    Recent Outpatient Visits           8 months ago Depression, unspecified depression type   [place] Susy Frizzle, MD   11 months ago [place] Eulogio Bear, NP   1 year ago Encounter to establish care   [place] Eulogio Bear, NP              Passed - Cr in normal range and within 360 days    Creat  Date Value Ref Range Status  03/12/2022 1.01 0.50 - 1.05 mg/dL Final         Passed - AST in normal range and within 360 days    AST  Date Value Ref Range Status  03/12/2022 17 10 - 35 U/L Final         Passed - ALT in normal range and within 360 days    ALT  Date Value Ref Range Status  03/12/2022 8 6 - 29 U/L Final         Passed - Completed PHQ-2 or PHQ-9 in the last 360 days      Passed - Last BP in normal range    BP Readings from Last 1 Encounters:  07/17/22 112/62

## 2022-11-29 ENCOUNTER — Encounter: Payer: Self-pay | Admitting: Family Medicine

## 2023-01-07 ENCOUNTER — Ambulatory Visit (HOSPITAL_COMMUNITY)
Admission: RE | Admit: 2023-01-07 | Discharge: 2023-01-07 | Disposition: A | Discharge status: Home / Self Care | Payer: BC Managed Care – PPO | Source: Ambulatory Visit | Attending: Family Medicine | Admitting: Family Medicine

## 2023-01-07 ENCOUNTER — Encounter: Payer: Self-pay | Admitting: Family Medicine

## 2023-01-07 ENCOUNTER — Ambulatory Visit: Payer: BC Managed Care – PPO | Admitting: Family Medicine

## 2023-01-07 VITALS — BP 118/68 | HR 62 | Temp 98.5°F | Ht 67.0 in | Wt 195.0 lb

## 2023-01-07 DIAGNOSIS — M5412 Radiculopathy, cervical region: Secondary | ICD-10-CM | POA: Diagnosis not present

## 2023-01-07 MED ORDER — ZEPBOUND 2.5 MG/0.5ML ~~LOC~~ SOAJ: 2.5000 mg | SUBCUTANEOUS | 3 refills | Status: DC

## 2023-01-07 MED ORDER — PREDNISONE 20 MG PO TABS: ORAL_TABLET | ORAL | 0 refills | Status: DC

## 2023-01-07 NOTE — Progress Notes (Signed)
Subjective:    Patient ID: Valerie Stuart, female    DOB: 1956-07-31, 67 y.o.   MRN: [id number]  Back Pain  Sinus Problem  Patient reports 2 months of pain originating in her right neck and radiating down her right arm into her right hand.  At times, she has weakness in her right hand and will have noticed decrease in grip strength.  At other times she will have an aching throbbing pain radiating from her shoulder down into her fingertips.  She also has right-sided sciatica but this is a more chronic problem.  The neck pain began about 2 months ago.  She denies any fevers or chills. 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   Allergies  Allergen Reactions   Bee Venom Anaphylaxis   Social History   Socioeconomic History   Marital status: Married    Spouse name: Not on file   Number of children: Not on file   Years of education: Not on file   Highest  education level: Not on file  Occupational History   Occupation: Checks group homes for compliance    Employer: [employer]  Tobacco Use   Smoking status: Never   Smokeless tobacco: Never  Vaping Use   Vaping Use: Never used  Substance and Sexual Activity   Alcohol use: No   Drug use: No   Sexual activity: Yes  Other Topics Concern   Not on file  Social History Narrative   Not on file   Social Determinants of Health   Financial Resource Strain: Not on file  Food Insecurity: Not on file  Transportation Needs: Not on file  Physical Activity: Not on file  Stress: Not on file  Social Connections: Not on file  Intimate Partner Violence: Not on file     Review of Systems  Musculoskeletal:  Positive for back pain.  All other systems reviewed and are negative.      Objective:   Physical Exam Vitals reviewed.  Constitutional:      Appearance: Normal appearance. She is normal weight.  Cardiovascular:     Rate and Rhythm: Normal rate and regular rhythm.     Heart sounds: Normal heart sounds. No murmur heard.  No friction rub. No gallop.  Pulmonary:     Effort: Pulmonary effort is normal. No respiratory distress.     Breath sounds: Normal breath sounds. No stridor. No wheezing or rales.  Musculoskeletal:     Cervical back: No rigidity. No pain with movement, spinous process tenderness or muscular tenderness.       Back:     Right lower leg: No edema.     Left lower leg: No edema.  Neurological:     General: No focal deficit present.     Mental Status: She is alert and oriented to person, place, and time. Mental status is at baseline.  Psychiatric:        Mood and Affect: Mood normal.        Behavior: Behavior normal.        Thought Content: Thought content normal.           Assessment & Plan:  Cervical radiculopathy - Plan: DG Cervical Spine Complete Begin by obtaining an x-ray of the neck.  Start prednisone taper pack.  Given the fact symptoms  have been present for 2 months I would proceed to an MRI if not improving

## 2023-01-11 ENCOUNTER — Telehealth: Payer: Self-pay

## 2023-01-11 ENCOUNTER — Encounter: Payer: Self-pay | Admitting: Family Medicine

## 2023-01-11 NOTE — Telephone Encounter (Signed)
Pt has reviewed x-ray results of back. Pt asks what steps she should take next as treatment? Thank you.

## 2023-01-21 ENCOUNTER — Telehealth: Payer: Self-pay

## 2023-01-21 NOTE — Telephone Encounter (Signed)
Pt called and states that the compounding pharmacy will not accept the compounded Ozempic Rx from her and it needs to be faxed from our office.  Can you re-write the prescription for me to fax?  The fax # is [phone]. Thank you.   Previous documentation: I have written a prescription for the semaglutide/cyanocobalamin 1/0.5 injection You can pick it up any time and mail it to the compounding pharmacy in [place].  If working, we could increase to 1 mg in a couple of months if you are tolerating it and seeing benefit.

## 2023-02-11 ENCOUNTER — Encounter: Payer: Self-pay | Admitting: Family Medicine

## 2023-02-14 ENCOUNTER — Other Ambulatory Visit: Payer: Self-pay | Admitting: Family Medicine

## 2023-02-14 DIAGNOSIS — M5412 Radiculopathy, cervical region: Secondary | ICD-10-CM

## 2023-02-22 ENCOUNTER — Other Ambulatory Visit: Payer: Self-pay | Admitting: Family Medicine

## 2023-02-22 DIAGNOSIS — M5412 Radiculopathy, cervical region: Secondary | ICD-10-CM

## 2023-02-28 ENCOUNTER — Ambulatory Visit: Payer: BC Managed Care – PPO | Admitting: Orthopaedic Surgery

## 2023-02-28 ENCOUNTER — Encounter: Payer: Self-pay | Admitting: Orthopaedic Surgery

## 2023-02-28 VITALS — Ht 67.0 in | Wt 190.0 lb

## 2023-02-28 DIAGNOSIS — M4722 Other spondylosis with radiculopathy, cervical region: Secondary | ICD-10-CM | POA: Insufficient documentation

## 2023-02-28 NOTE — Addendum Note (Signed)
Addended by: Rogers Seeds on: 02/28/2023 10:33 AM   Modules accepted: Orders

## 2023-02-28 NOTE — Progress Notes (Signed)
Office Visit Note   Patient: Valerie Stuart           Date of Birth: Dec 18, 1955           MRN: [id number] Visit Date: 02/28/2023              Requested by: Donita Brooks, MD [address] [place],  [place] [address] PCP: Donita Brooks, MD   Assessment & Plan: Visit Diagnoses:  1. Other spondylosis with radiculopathy, cervical region     Plan: Will set up for some home cervical traction since she gets relief with distraction.  We reviewed MRI scan and she particularly has foraminal stenosis most severe at C5-6 level on the right minimal narrowing on the left.  Will set up for therapy in [place] where she lives and he can outpatient.  Recheck 6 weeks if she is having persistent symptoms then we will proceed with MRI scan.  Follow-Up Instructions: Return in about 6 weeks (around 04/11/2023).   Orders:  No orders of the defined types were placed in this encounter.  No orders of the defined types were placed in this encounter.     Procedures: No procedures performed   Clinical Data: No additional findings.   Subjective: Chief Complaint  Patient presents with   Neck - Pain    HPI 67 year old female with greater than 73-month history of neck pain right arm pain numbness in her right dominant hand.  She is taken a prednisone Dosepak helped slightly ordered by Dr. Rubin Payor.  He ordered an MRI scan but was denied by insurance.  She states she has pain with rotation of her neck.  She also mentions that she has had several years problems with a little bit of sciatica that mostly goes down her right leg.  She denies any myelopathic symptoms bladder symptoms.  Patient does have a history of some anxiety and depression.  Review of Systems all other systems noncontributory to HPI.   Objective: Vital Signs: Ht  (1.702 m)   Wt 190 lb (86.2 kg)   BMI 29.76 kg/m   Physical Exam Constitutional:      Appearance: She is well-developed.  HENT:     Head:  Normocephalic.     Right Ear: External ear normal.     Left Ear: External ear normal. There is no impacted cerumen.  Eyes:     Pupils: Pupils are equal, round, and reactive to light.  Neck:     Thyroid: No thyromegaly.     Trachea: No tracheal deviation.  Cardiovascular:     Rate and Rhythm: Normal rate.  Pulmonary:     Effort: Pulmonary effort is normal.  Abdominal:     Palpations: Abdomen is soft.  Musculoskeletal:     Cervical back: No rigidity.  Skin:    General: Skin is warm and dry.  Neurological:     Mental Status: She is alert and oriented to person, place, and time.  Psychiatric:        Behavior: Behavior normal.     Ortho Exam Spurling on the right negative on the left positive brachial plexus tenderness on the right.  No lower extremity clonus no hyperreflexia normal heel-toe gait.  Upper extremity reflexes biceps triceps brachial radialis are 2+ and symmetrical.  No impingement the shoulder no isolated motor weakness upper extremity.  Negative Lhermitte.  Increased pain with cervical compression she gets relief with distraction.  Specialty Comments:  No specialty comments available.  Imaging: Narrative & Impression  CLINICAL DATA:  Cervical radiculopathy.   EXAM: CERVICAL SPINE - COMPLETE 4+ VIEW   COMPARISON:  None Available.   FINDINGS: There is no evidence of cervical spine fracture or prevertebral soft tissue swelling. Alignment is normal. Mild intervertebral disc space narrowing and endplate osteophyte formation is noted at C4-C5, C5-C6 and C6-C7 with mild neural foraminal stenosis at C5-C6 and C6-C7 on the left and C4-C5, C5-C6, and C6-C7 on the right. The visualized portion of the dens is intact and lateral masses are symmetric.   IMPRESSION: Mild degenerative changes in the cervical spine from C4-C7.     Electronically Signed   By: Thornell Sartorius M.D.     PMFS History: Patient Active Problem List   Diagnosis Date Noted   Other spondylosis  with radiculopathy, cervical region 02/28/2023   BMI 29.0-29.9,adult 09/14/2021   Low ferritin 09/14/2021   Depression 03/03/2019   Insomnia 01/09/2019   Anxiety 01/09/2019   History of sleep apnea 04/20/2016   Past Medical History:  Diagnosis Date   Anxiety    Bradycardia, unspecified 06/06/2017   Symptomatic bradycardia   Colon cancer screening 09/12/2021   Depression    Fatigue 06/06/2017   Hyperlipidemia     Family History  Problem Relation Age of Onset   Heart disease Mother    Hypertension Mother    Alzheimer's disease Father    Hypertension Brother     Past Surgical History:  Procedure Laterality Date   BREATH TEK H PYLORI N/A 04/22/2015   Procedure: BREATH TEK H PYLORI;  Surgeon: Gaynelle Adu, MD;  Location: [facility];  Service: General;  Laterality: N/A;   CHOLECYSTECTOMY  2017   HERNIA REPAIR  2017   LAPAROSCOPIC GASTRIC RESTRICTIVE DUODENAL PROCEDURE (DUODENAL SWITCH)  2017   with bilio-pancreatic diversion   TONSILLECTOMY     Social History   Occupational History   Occupation: [employer]    Employer: [employer]  Tobacco Use   Smoking status: Never   Smokeless tobacco: Never  Vaping Use   Vaping Use: Never used  Substance and Sexual Activity   Alcohol use: No   Drug use: No   Sexual activity: Yes

## 2023-03-11 ENCOUNTER — Encounter: Payer: Self-pay | Admitting: Family Medicine

## 2023-03-13 ENCOUNTER — Ambulatory Visit (HOSPITAL_COMMUNITY): Payer: BC Managed Care – PPO | Attending: Orthopaedic Surgery | Admitting: Physical Therapy

## 2023-03-13 DIAGNOSIS — M542 Cervicalgia: Secondary | ICD-10-CM | POA: Insufficient documentation

## 2023-03-13 DIAGNOSIS — M4722 Other spondylosis with radiculopathy, cervical region: Secondary | ICD-10-CM | POA: Diagnosis not present

## 2023-03-13 NOTE — Therapy (Signed)
OUTPATIENT PHYSICAL THERAPY CERVICAL EVALUATION   Patient Name: Valerie Stuart MRN: [id number] DOB:1956/10/23, 67 y.o., female Today's Date: 03/13/2023  END OF SESSION:  PT End of Session - 03/13/23 1111     Visit Number 1    Number of Visits 12    Date for PT Re-Evaluation 04/24/23    Authorization Type BCBS    Progress Note Due on Visit 10    PT Start Time 1033    PT Stop Time 1113    PT Time Calculation (min) 40 min    Activity Tolerance Patient tolerated treatment well    Behavior During Therapy [facility] for tasks assessed/performed             Past Medical History:  Diagnosis Date   Anxiety    Bradycardia, unspecified 06/06/2017   Symptomatic bradycardia   Colon cancer screening 09/12/2021   Depression    Fatigue 06/06/2017   Hyperlipidemia    Past Surgical History:  Procedure Laterality Date   BREATH TEK H PYLORI N/A 04/22/2015   Procedure: BREATH TEK H PYLORI;  Surgeon: Gaynelle Adu, MD;  Location: [facility];  Service: General;  Laterality: N/A;   CHOLECYSTECTOMY  2017   HERNIA REPAIR  2017   LAPAROSCOPIC GASTRIC RESTRICTIVE DUODENAL PROCEDURE (DUODENAL SWITCH)  2017   with bilio-pancreatic diversion   TONSILLECTOMY     Patient Active Problem List   Diagnosis Date Noted   Other spondylosis with radiculopathy, cervical region 02/28/2023   BMI 29.0-29.9,adult 09/14/2021   Low ferritin 09/14/2021   Depression 03/03/2019   Insomnia 01/09/2019   Anxiety 01/09/2019   History of sleep apnea 04/20/2016    PCP: Lynnea Ferrier MD  REFERRING PROVIDER: Eldred Manges, MD  REFERRING DIAG: [phone] (ICD-10-CM) - Other spondylosis with radiculopathy, cervical region  THERAPY DIAG:  Cervicalgia - Plan: PT plan of care cert/re-cert  Rationale for Evaluation and Treatment: Rehabilitation  ONSET DATE: >6 months   SUBJECTIVE:                                                                                                                                                                                                          SUBJECTIVE STATEMENT: Patient presents to therapy with complaint of progressive neck pain and RUE weakness. This began insidiously about 6-7 months ago. Neck pain has worsened and RUE numbness and tingling has been ongoing. Sometimes loses grip and drops things with RUE. She has tried some stretches and exercises online but they have not been helpful. She had taken course of steroids also, but this  was not very helpful.   Hand dominance: Right  PERTINENT HISTORY:  NA  PAIN:  Are you having pain? Yes: NPRS scale: 6/10 Pain location: neck Pain description: dull, aching, annoying  Aggravating factors: prolonged sitting, typing Relieving factors: rest, Aleve   PRECAUTIONS: None  WEIGHT BEARING RESTRICTIONS: No  FALLS:  Has patient fallen in last 6 months? 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   COGNITION: Overall cognitive status: Within functional limits for tasks assessed  SENSATION: Light touch: Impaired   POSTURE: rounded shoulders and forward head  SURVEY:  FOTO 61% function   CERVICAL ROM:   Active ROM A/PROM (deg) eval  Flexion 47 P!  Extension 53  Right lateral flexion   Left lateral flexion   Right rotation 58P!  Left rotation 66   (Blank rows = not tested)  UPPER EXTREMITY ROM:  WFL but painful end range with RT shoulder ER/IR/ Flexion   UPPER EXTREMITY MMT:  MMT Right eval Left eval  Shoulder flexion 4+ 5  Shoulder extension    Shoulder abduction 4 5  Shoulder adduction    Shoulder extension    Shoulder internal rotation    Shoulder external rotation 5 5  Middle trapezius    Lower  trapezius    Elbow flexion    Elbow extension    Wrist flexion    Wrist extension    Wrist ulnar deviation    Wrist radial deviation    Wrist pronation    Wrist supination    Grip strength     (Blank rows = not tested)   TODAY'S TREATMENT:                                                                                                                              DATE:  03/13/23 Eval   PATIENT EDUCATION:  Education details: on Eval findings, POC and HEP  Person educated: Patient Education method: Explanation Education comprehension: verbalized understanding  HOME EXERCISE PROGRAM: Access Code: [email or web address] URL: [email or web address] Date: 03/13/2023 Prepared by: Georges Lynch  Exercises - Seated Scapular Retraction  - 3 x daily - 7 x weekly - 2 sets - 10 reps - 2-3 second hold - Cervical Retraction with Overpressure  - 3 x daily - 7 x weekly - 2 sets - 10 reps - 5 second hold  ASSESSMENT:  CLINICAL IMPRESSION: Patient is a 67 y.o. female who presents to physical therapy with complaint of neck pain. Patient demonstrates decreased strength, ROM restriction, reduced flexibility, increased tenderness to palpation and postural abnormalities which are likely contributing to symptoms of pain and are negatively impacting patient ability to perform ADLs. Patient will benefit from skilled  physical therapy services to address these deficits to reduce pain and improve level of function with ADLs   OBJECTIVE IMPAIRMENTS: decreased activity tolerance, decreased mobility, decreased ROM, decreased strength, hypomobility, increased fascial restrictions, impaired flexibility, impaired UE functional use, improper body mechanics, postural dysfunction, and pain.   ACTIVITY LIMITATIONS: carrying, lifting, bending, and reach over head  PARTICIPATION LIMITATIONS: meal prep, cleaning, laundry, driving, shopping, community activity, occupation, and yard work  PERSONAL FACTORS:  NA  are  also affecting patient's functional outcome.   REHAB POTENTIAL: Good  CLINICAL DECISION MAKING: Stable/uncomplicated  EVALUATION COMPLEXITY: Low   GOALS: SHORT TERM GOALS: Target date: 04/03/2023  Patient will be independent with initial HEP and self-management strategies to improve functional outcomes Baseline:  Goal status: INITIAL    LONG TERM GOALS: Target date: 04/24/2023  Patient will be independent with advanced HEP and self-management strategies to improve functional outcomes Baseline:  Goal status: INITIAL  2.  Patient will improve FOTO score to predicted value to indicate improvement in functional outcomes Baseline: 61% Goal status: INITIAL  3.  Patient will demo improved RT cervical rotation by at least 5 degrees in order to improve ability to scan environment for safety and while driving. Baseline: See AROM Goal status: INITIAL  4. Patient will report a decrease in neck pain to no more than 3/10 for improved quality of life and ability to perform UE ADLs  Baseline: 6/10 Goal status: INITIAL PLAN:  PT FREQUENCY: 1-2x/week  PT DURATION: 6 weeks  PLANNED INTERVENTIONS: Therapeutic exercises, Therapeutic activity, Neuromuscular re-education, Balance training, Gait training, Patient/Family education, Joint manipulation, Joint mobilization, Stair training, Aquatic Therapy, Dry Needling, Electrical stimulation, Spinal manipulation, Spinal mobilization, Cryotherapy, Moist heat, scar mobilization, Taping, Traction, Ultrasound, Biofeedback, Ionotophoresis 4mg /ml Dexamethasone, and Manual therapy.   PLAN FOR NEXT SESSION: F/u on chin tucks. Progress postural strengthening as tolerated. Manual as needed to address pain and restriction.   11:14 AM, 03/13/23 Georges Lynch PT DPT  Physical Therapist with [facility]  [phone]

## 2023-03-21 ENCOUNTER — Encounter (HOSPITAL_COMMUNITY): Payer: Self-pay

## 2023-03-21 ENCOUNTER — Ambulatory Visit (HOSPITAL_COMMUNITY): Payer: BC Managed Care – PPO

## 2023-03-21 DIAGNOSIS — M542 Cervicalgia: Secondary | ICD-10-CM | POA: Diagnosis not present

## 2023-03-21 NOTE — Therapy (Signed)
OUTPATIENT PHYSICAL THERAPY CERVICAL TREATMENT   Patient Name: Valerie Stuart MRN: [id number] DOB:16-Feb-1956, 67 y.o., female Today's Date: 03/21/2023  END OF SESSION:  PT End of Session - 03/21/23 0819     Visit Number 2    Number of Visits 12    Date for PT Re-Evaluation 04/24/23    Authorization Type BCBS    Progress Note Due on Visit 10    PT Start Time 0734    PT Stop Time 0814    PT Time Calculation (min) 40 min    Activity Tolerance Patient tolerated treatment well    Behavior During Therapy [facility] for tasks assessed/performed              Past Medical History:  Diagnosis Date   Anxiety    Bradycardia, unspecified 06/06/2017   Symptomatic bradycardia   Colon cancer screening 09/12/2021   Depression    Fatigue 06/06/2017   Hyperlipidemia    Past Surgical History:  Procedure Laterality Date   BREATH TEK H PYLORI N/A 04/22/2015   Procedure: BREATH TEK H PYLORI;  Surgeon: Gaynelle Adu, MD;  Location: [facility];  Service: General;  Laterality: N/A;   CHOLECYSTECTOMY  2017   HERNIA REPAIR  2017   LAPAROSCOPIC GASTRIC RESTRICTIVE DUODENAL PROCEDURE (DUODENAL SWITCH)  2017   with bilio-pancreatic diversion   TONSILLECTOMY     Patient Active Problem List   Diagnosis Date Noted   Other spondylosis with radiculopathy, cervical region 02/28/2023   BMI 29.0-29.9,adult 09/14/2021   Low ferritin 09/14/2021   Depression 03/03/2019   Insomnia 01/09/2019   Anxiety 01/09/2019   History of sleep apnea 04/20/2016    PCP: Lynnea Ferrier MD  REFERRING PROVIDER: Eldred Manges, MD  REFERRING DIAG: [phone] (ICD-10-CM) - Other spondylosis with radiculopathy, cervical region  THERAPY DIAG:  Cervicalgia  Rationale for Evaluation and Treatment: Rehabilitation  ONSET DATE: >6 months   SUBJECTIVE:                                                                                                                                                                                                          SUBJECTIVE STATEMENT: 03/21/23:  Pt stated she has Rt UE radicular symptoms to all fingers and Rt side neck pain scale 5/10.  Stated pain increases during computer work through out the day.  Has began HEP without questions.  Eval:  Patient presents to therapy with complaint of progressive neck pain and RUE weakness. This began insidiously about 6-7 months ago. Neck pain has worsened and RUE numbness and tingling  has been ongoing. Sometimes loses grip and drops things with RUE. She has tried some stretches and exercises online but they have not been helpful. She had taken course of steroids also, but this was not very helpful.   Hand dominance: Right  PERTINENT HISTORY:  NA  PAIN:  Are you having pain? Yes: NPRS scale: 6/10 Pain location: neck Pain description: dull, aching, annoying  Aggravating factors: prolonged sitting, typing Relieving factors: rest, Aleve   PRECAUTIONS: None  WEIGHT BEARING RESTRICTIONS: No  FALLS:  Has patient fallen in last 6 months? 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   COGNITION: Overall cognitive status: Within functional limits for tasks assessed  SENSATION: Light touch: Impaired   POSTURE: rounded shoulders and forward head  SURVEY:  FOTO 61% function   CERVICAL ROM:   Active ROM A/PROM (deg) eval  Flexion 47 P!  Extension 53  Right lateral flexion   Left lateral flexion   Right rotation 58P!  Left rotation 66  / (Blank rows = not tested)  UPPER EXTREMITY ROM:  WFL but painful end range with RT shoulder ER/IR/ Flexion   UPPER EXTREMITY MMT:  MMT Right eval Left eval  Shoulder flexion  4+ 5  Shoulder extension    Shoulder abduction 4 5  Shoulder adduction    Shoulder extension    Shoulder internal rotation    Shoulder external rotation 5 5  Middle trapezius    Lower trapezius    Elbow flexion    Elbow extension    Wrist flexion    Wrist extension    Wrist ulnar deviation    Wrist radial deviation    Wrist pronation    Wrist supination    Grip strength     (Blank rows = not tested)   TODAY'S TREATMENT:                                                                                                                              DATE:  03/21/23: Reviewed goals Educated importance of HEP Seated posture with lumbar support Chin tuck 10x 5" Scapular retraction 10x  Wback 10x 5" UT stretch 3x 30" Manual: supine position cervical mm: supraspinatus, UT, levator scapula; manual traction x 2 min  03/13/23 Eval   PATIENT EDUCATION:  Education details: on Eval findings, POC and HEP  Person educated: Patient Education method: Explanation Education comprehension: verbalized understanding  HOME EXERCISE PROGRAM: Access Code: [email or web address] URL: [email or web address] Date: 03/13/2023 Prepared by: Georges Lynch  Exercises - Seated Scapular Retraction  - 3 x daily - 7 x weekly - 2 sets - 10 reps - 2-3 second hold -  Cervical Retraction with Overpressure  - 3 x daily - 7 x weekly - 2 sets - 10 reps - 5 second hold  03/21/23: - Seated Scapular Retraction with External Rotation  - 1 x daily - 7 x weekly - 3 sets - 10 reps - Seated Cervical Sidebending Stretch  - 1 x daily - 7 x weekly - 3 sets - 3 reps - 30" hold  ASSESSMENT:  CLINICAL IMPRESSION: 03/21/23:  Reviewed goals and educated importance of HEP compliance.  Pt able to recall with some cueing to improve chin tuck and reduce UT activation during scapular retraction.  Therex focus with postural strengthening and cervical mobility with additional UT stretch and Wback added to HEP.  EOS with manual  techniques to address soft tissue restrictions for pain control, reports of decreased radicular symptoms following manual traction.  Pt given list of local massage therapists and encouraged to stay hydrated to reduce risk of headache following manual.  Eval:  Patient is a 67 y.o. female who presents to physical therapy with complaint of neck pain. Patient demonstrates decreased strength, ROM restriction, reduced flexibility, increased tenderness to palpation and postural abnormalities which are likely contributing to symptoms of pain and are negatively impacting patient ability to perform ADLs. Patient will benefit from skilled physical therapy services to address these deficits to reduce pain and improve level of function with ADLs   OBJECTIVE IMPAIRMENTS: decreased activity tolerance, decreased mobility, decreased ROM, decreased strength, hypomobility, increased fascial restrictions, impaired flexibility, impaired UE functional use, improper body mechanics, postural dysfunction, and pain.   ACTIVITY LIMITATIONS: carrying, lifting, bending, and reach over head  PARTICIPATION LIMITATIONS: meal prep, cleaning, laundry, driving, shopping, community activity, occupation, and yard work  PERSONAL FACTORS:  NA  are also affecting patient's functional outcome.   REHAB POTENTIAL: Good  CLINICAL DECISION MAKING: Stable/uncomplicated  EVALUATION COMPLEXITY: Low   GOALS: SHORT TERM GOALS: Target date: 04/03/2023  Patient will be independent with initial HEP and self-management strategies to improve functional outcomes Baseline:  Goal status: IN PROGRESS    LONG TERM GOALS: Target date: 04/24/2023  Patient will be independent with advanced HEP and self-management strategies to improve functional outcomes Baseline:  Goal status: IN PROGRESS  2.  Patient will improve FOTO score to predicted value to indicate improvement in functional outcomes Baseline: 61% Goal status: IN PROGRESS  3.  Patient  will demo improved RT cervical rotation by at least 5 degrees in order to improve ability to scan environment for safety and while driving. Baseline: See AROM Goal status: IN PROGRESS  4. Patient will report a decrease in neck pain to no more than 3/10 for improved quality of life and ability to perform UE ADLs  Baseline: 6/10 Goal status: IN PROGRESS PLAN:  PT FREQUENCY: 1-2x/week  PT DURATION: 6 weeks  PLANNED INTERVENTIONS: Therapeutic exercises, Therapeutic activity, Neuromuscular re-education, Balance training, Gait training, Patient/Family education, Joint manipulation, Joint mobilization, Stair training, Aquatic Therapy, Dry Needling, Electrical stimulation, Spinal manipulation, Spinal mobilization, Cryotherapy, Moist heat, scar mobilization, Taping, Traction, Ultrasound, Biofeedback, Ionotophoresis 4mg /ml Dexamethasone, and Manual therapy.   PLAN FOR NEXT SESSION: F/u on chin tucks. Progress postural strengthening as tolerated. Manual as needed to address pain and restriction.  Add pec stretch.  Becky Sax, LPTA/CLT; CBIS [phone]  8:20 AM, 03/21/23

## 2023-04-04 ENCOUNTER — Encounter (HOSPITAL_COMMUNITY): Payer: BC Managed Care – PPO

## 2023-04-10 ENCOUNTER — Encounter (HOSPITAL_COMMUNITY): Payer: Self-pay

## 2023-04-10 ENCOUNTER — Ambulatory Visit (HOSPITAL_COMMUNITY): Payer: BC Managed Care – PPO

## 2023-04-10 DIAGNOSIS — M542 Cervicalgia: Secondary | ICD-10-CM

## 2023-04-10 NOTE — Therapy (Signed)
OUTPATIENT PHYSICAL THERAPY CERVICAL TREATMENT   Patient Name: Valerie Stuart MRN: [id number] DOB:January 01, 1956, 67 y.o., female Today's Date: 04/10/2023  END OF SESSION:  PT End of Session - 04/10/23 0750     Visit Number 3    Number of Visits 12    Date for PT Re-Evaluation 04/24/23    Authorization Type BCBS    Progress Note Due on Visit 10    PT Start Time 0738    PT Stop Time 0819    PT Time Calculation (min) 41 min    Activity Tolerance Patient tolerated treatment well    Behavior During Therapy [facility] for tasks assessed/performed               Past Medical History:  Diagnosis Date   Anxiety    Bradycardia, unspecified 06/06/2017   Symptomatic bradycardia   Colon cancer screening 09/12/2021   Depression    Fatigue 06/06/2017   Hyperlipidemia    Past Surgical History:  Procedure Laterality Date   BREATH TEK H PYLORI N/A 04/22/2015   Procedure: BREATH TEK H PYLORI;  Surgeon: Gaynelle Adu, MD;  Location: [facility];  Service: General;  Laterality: N/A;   CHOLECYSTECTOMY  2017   HERNIA REPAIR  2017   LAPAROSCOPIC GASTRIC RESTRICTIVE DUODENAL PROCEDURE (DUODENAL SWITCH)  2017   with bilio-pancreatic diversion   TONSILLECTOMY     Patient Active Problem List   Diagnosis Date Noted   Other spondylosis with radiculopathy, cervical region 02/28/2023   BMI 29.0-29.9,adult 09/14/2021   Low ferritin 09/14/2021   Depression 03/03/2019   Insomnia 01/09/2019   Anxiety 01/09/2019   History of sleep apnea 04/20/2016    PCP: Lynnea Ferrier MD  REFERRING PROVIDER: Eldred Manges, MD  REFERRING DIAG: [phone] (ICD-10-CM) - Other spondylosis with radiculopathy, cervical region  THERAPY DIAG:  Cervicalgia  Rationale for Evaluation and Treatment: Rehabilitation  ONSET DATE: >6 months   SUBJECTIVE:                                                                                                                                                                                                          SUBJECTIVE STATEMENT: 04/10/23:  Continues to have radicular symptoms to all fingers Rt UE, reports she is becoming more aware of posture.  Has been compliant with HEP.  Stated she has to drive to [place] later today for work.    Eval:  Patient presents to therapy with complaint of progressive neck pain and RUE weakness. This began insidiously about 6-7 months ago. Neck pain has worsened and  RUE numbness and tingling has been ongoing. Sometimes loses grip and drops things with RUE. She has tried some stretches and exercises online but they have not been helpful. She had taken course of steroids also, but this was not very helpful.   Hand dominance: Right  PERTINENT HISTORY:  NA  PAIN:  Are you having pain? Yes: NPRS scale: 6/10 Pain location: neck Pain description: dull, aching, annoying  Aggravating factors: prolonged sitting, typing Relieving factors: rest, Aleve   PRECAUTIONS: None  WEIGHT BEARING RESTRICTIONS: No  FALLS:  Has patient fallen in last 6 months? 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   COGNITION: Overall cognitive status: Within functional limits for tasks assessed  SENSATION: Light touch: Impaired   POSTURE: rounded shoulders and forward head  SURVEY:  FOTO 61% function   CERVICAL ROM:   Active ROM A/PROM (deg) eval  Flexion 47 P!  Extension 53  Right lateral flexion   Left lateral flexion   Right rotation 58P!  Left rotation 66  / (Blank rows = not tested)  UPPER EXTREMITY ROM:  WFL but painful end range with RT shoulder ER/IR/ Flexion   UPPER EXTREMITY MMT:  MMT Right eval Left eval   Shoulder flexion 4+ 5  Shoulder extension    Shoulder abduction 4 5  Shoulder adduction    Shoulder extension    Shoulder internal rotation    Shoulder external rotation 5 5  Middle trapezius    Lower trapezius    Elbow flexion    Elbow extension    Wrist flexion    Wrist extension    Wrist ulnar deviation    Wrist radial deviation    Wrist pronation    Wrist supination    Grip strength     (Blank rows = not tested)   TODAY'S TREATMENT:                                                                                                                              DATE:  04/10/23: Claudie Fisherman tuck 10x 5" Wback 10x 5" RTB rows 2x 10 GTB shoulder extension 2x 10 Corner stretch 3x 30" Manual supine position cervical mm: supraspinatus, UT, levator scapula; manual traction x 2 min  03/21/23: Reviewed goals Educated importance of HEP Seated posture with lumbar support Chin tuck 10x 5" Scapular retraction 10x  Wback 10x 5" UT stretch 3x 30" Manual: supine position cervical mm: supraspinatus, UT, levator scapula; manual traction x 2 min  03/13/23 Eval   PATIENT EDUCATION:  Education details: on Eval findings, POC and HEP  Person educated: Patient Education method: Explanation Education comprehension: verbalized understanding  HOME EXERCISE PROGRAM: Access Code: [email or web address] URL: [email or web address] Date: 03/13/2023 Prepared by: Georges Lynch  Exercises - Seated Scapular Retraction  - 3 x daily - 7 x weekly - 2 sets - 10 reps - 2-3 second hold - Cervical Retraction with Overpressure  - 3 x daily - 7 x weekly - 2 sets - 10 reps - 5 second hold  03/21/23: - Seated Scapular Retraction with External Rotation  - 1 x daily - 7 x weekly - 3 sets - 10 reps - Seated Cervical Sidebending Stretch  - 1 x daily - 7 x weekly - 3 sets - 3 reps - 30" hold  04/10/23: -GTB shoulder extension and rows -Corner pec stretch   ASSESSMENT:  CLINICAL IMPRESSION: 04/10/23:  Session  focus with postural strengthening and manual techniques to assist with pain and radicular symptoms.  Reviewed mechanics/form with chin tuck with improved mechanics following tactile cueing.  Progressed postural strengthening with additional theraband resistance and pec stretch with positive results.  EOS with manual to address soft tissue restrictions for pain control, reports of pain and radicular symptoms reduced to 2/10 and decreased tingling to fingers following manual.  Additional printouts given to pt to add to HEP.    Eval:  Patient is a 67 y.o. female who presents to physical therapy with complaint of neck pain. Patient demonstrates decreased strength, ROM restriction, reduced flexibility, increased tenderness to palpation and postural abnormalities which are likely contributing to symptoms of pain and are negatively impacting patient ability to perform ADLs. Patient will benefit from skilled physical therapy services to address these deficits to reduce pain and improve level of function with ADLs   OBJECTIVE IMPAIRMENTS: decreased activity tolerance, decreased mobility, decreased ROM, decreased strength, hypomobility, increased fascial restrictions, impaired flexibility, impaired UE functional use, improper body mechanics, postural dysfunction, and pain.   ACTIVITY LIMITATIONS: carrying, lifting, bending, and reach over head  PARTICIPATION LIMITATIONS: meal prep, cleaning, laundry, driving, shopping, community activity, occupation, and yard work  PERSONAL FACTORS:  NA  are also affecting patient's functional outcome.   REHAB POTENTIAL: Good  CLINICAL DECISION MAKING: Stable/uncomplicated  EVALUATION COMPLEXITY: Low   GOALS: SHORT TERM GOALS: Target date: 04/03/2023  Patient will be independent with initial HEP and self-management strategies to improve functional outcomes Baseline:  Goal status: IN PROGRESS    LONG TERM GOALS: Target date: 04/24/2023  Patient will be independent  with advanced HEP and self-management strategies to improve functional outcomes Baseline:  Goal status: IN PROGRESS  2.  Patient will improve FOTO score to predicted value to indicate improvement in functional outcomes Baseline: 61% Goal status: IN PROGRESS  3.  Patient will demo improved RT cervical rotation by at least 5 degrees in order to improve ability to scan environment for safety and while driving. Baseline: See AROM Goal status: IN PROGRESS  4. Patient will report a decrease in neck pain to no more than 3/10 for improved quality of life and ability to perform UE ADLs  Baseline: 6/10 Goal status: IN PROGRESS PLAN:  PT FREQUENCY: 1-2x/week  PT DURATION: 6 weeks  PLANNED INTERVENTIONS: Therapeutic exercises, Therapeutic activity, Neuromuscular re-education, Balance training, Gait training, Patient/Family education, Joint manipulation, Joint mobilization, Stair training, Aquatic Therapy, Dry Needling, Electrical stimulation, Spinal manipulation, Spinal mobilization, Cryotherapy, Moist heat, scar mobilization, Taping, Traction, Ultrasound, Biofeedback, Ionotophoresis 4mg /ml Dexamethasone, and Manual therapy.   PLAN FOR NEXT SESSION: F/u on chin tucks. Progress postural strengthening as tolerated. Manual as needed to address pain and restriction.  Add ER with  therabands next session.  Becky Sax, LPTA/CLT; CBIS [phone]  8:44 AM, 04/10/23

## 2023-04-11 ENCOUNTER — Ambulatory Visit: Payer: BC Managed Care – PPO | Admitting: Orthopaedic Surgery

## 2023-04-18 ENCOUNTER — Ambulatory Visit (HOSPITAL_COMMUNITY): Payer: BC Managed Care – PPO | Attending: Orthopaedic Surgery

## 2023-04-18 DIAGNOSIS — M542 Cervicalgia: Secondary | ICD-10-CM | POA: Diagnosis present

## 2023-04-18 NOTE — Therapy (Signed)
OUTPATIENT PHYSICAL THERAPY CERVICAL TREATMENT   Patient Name: Valerie Stuart MRN: [id number] DOB:May 03, 1956, 67 y.o., female Today's Date: 04/18/2023  END OF SESSION:  PT End of Session - 04/18/23 0734     Visit Number 4    Number of Visits 12    Date for PT Re-Evaluation 04/24/23    Authorization Type BCBS    Progress Note Due on Visit 10    PT Start Time 0733    PT Stop Time 0813    PT Time Calculation (min) 40 min    Activity Tolerance Patient tolerated treatment well    Behavior During Therapy [facility] for tasks assessed/performed               Past Medical History:  Diagnosis Date   Anxiety    Bradycardia, unspecified 06/06/2017   Symptomatic bradycardia   Colon cancer screening 09/12/2021   Depression    Fatigue 06/06/2017   Hyperlipidemia    Past Surgical History:  Procedure Laterality Date   BREATH TEK H PYLORI N/A 04/22/2015   Procedure: BREATH TEK H PYLORI;  Surgeon: Gaynelle Adu, MD;  Location: [facility];  Service: General;  Laterality: N/A;   CHOLECYSTECTOMY  2017   HERNIA REPAIR  2017   LAPAROSCOPIC GASTRIC RESTRICTIVE DUODENAL PROCEDURE (DUODENAL SWITCH)  2017   with bilio-pancreatic diversion   TONSILLECTOMY     Patient Active Problem List   Diagnosis Date Noted   Other spondylosis with radiculopathy, cervical region 02/28/2023   BMI 29.0-29.9,adult 09/14/2021   Low ferritin 09/14/2021   Depression 03/03/2019   Insomnia 01/09/2019   Anxiety 01/09/2019   History of sleep apnea 04/20/2016    PCP: Lynnea Ferrier MD  REFERRING PROVIDER: Eldred Manges, MD  REFERRING DIAG: [phone] (ICD-10-CM) - Other spondylosis with radiculopathy, cervical region  THERAPY DIAG:  Cervicalgia  Rationale for Evaluation and Treatment: Rehabilitation  ONSET DATE: >6 months   SUBJECTIVE:                                                                                                                                                                                                          SUBJECTIVE STATEMENT: Right hand not tingling as much this morning but unsure if it's because she has not yet been typing or did not sleep on that side. 6/10 tingling and aching right hand   Eval:  Patient presents to therapy with complaint of progressive neck pain and RUE weakness. This began insidiously about 6-7 months ago. Neck pain has worsened and RUE numbness and tingling has been ongoing.  Sometimes loses grip and drops things with RUE. She has tried some stretches and exercises online but they have not been helpful. She had taken course of steroids also, but this was not very helpful.   Hand dominance: Right  PERTINENT HISTORY:  NA  PAIN:  Are you having pain? Yes: NPRS scale: 6/10 Pain location: neck Pain description: dull, aching, annoying  Aggravating factors: prolonged sitting, typing Relieving factors: rest, Aleve   PRECAUTIONS: None  WEIGHT BEARING RESTRICTIONS: No  FALLS:  Has patient fallen in last 6 months? 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   COGNITION: Overall cognitive status: Within functional limits for tasks assessed  SENSATION: Light touch: Impaired   POSTURE: rounded shoulders and forward head  SURVEY:  FOTO 61% function   CERVICAL ROM:   Active ROM A/PROM (deg) eval  Flexion 47 P!  Extension 53  Right lateral flexion   Left lateral flexion   Right rotation 58P!  Left rotation 66  / (Blank rows = not tested)  UPPER EXTREMITY ROM:  WFL but painful end range with RT shoulder ER/IR/ Flexion   UPPER EXTREMITY MMT:  MMT Right eval Left eval Right 6/6  Shoulder flexion 4+ 5 4*  Shoulder  extension     Shoulder abduction 4 5 4+  Shoulder adduction     Shoulder extension     Shoulder internal rotation     Shoulder external rotation 5 5   Middle trapezius     Lower trapezius     Elbow flexion     Elbow extension     Wrist flexion     Wrist extension     Wrist ulnar deviation     Wrist radial deviation     Wrist pronation     Wrist supination     Grip strength      (Blank rows = not tested)   TODAY'S TREATMENT:                                                                                                                              DATE:  04/18/23 MMT right shoulder Flexion and abduction Supine: Cervical retraction 5" hold 2 x 10 Cervical retraction with overpressure x 10 Manual traction in supine x 10' with 5" holds   04/10/23: Chin tuck 10x 5" Wback 10x 5" RTB rows 2x 10 GTB shoulder extension 2x 10 Corner stretch 3x 30" Manual supine position cervical mm: supraspinatus, UT, levator scapula; manual traction x 2 min  03/21/23: Reviewed goals Educated importance of HEP Seated posture with lumbar support Chin tuck 10x 5" Scapular retraction 10x  Wback 10x 5" UT  stretch 3x 30" Manual: supine position cervical mm: supraspinatus, UT, levator scapula; manual traction x 2 min  03/13/23 Eval   PATIENT EDUCATION:  Education details: on Eval findings, POC and HEP  Person educated: Patient Education method: Explanation Education comprehension: verbalized understanding  HOME EXERCISE PROGRAM: Access Code: [email or web address] URL: [email or web address] Date: 03/13/2023 Prepared by: Georges Aerilyn Slee  Exercises - Seated Scapular Retraction  - 3 x daily - 7 x weekly - 2 sets - 10 reps - 2-3 second hold - Cervical Retraction with Overpressure  - 3 x daily - 7 x weekly - 2 sets - 10 reps - 5 second hold  03/21/23: - Seated Scapular Retraction with External Rotation  - 1 x daily - 7 x weekly - 3 sets - 10 reps - Seated Cervical Sidebending Stretch  - 1 x  daily - 7 x weekly - 3 sets - 3 reps - 30" hold  04/10/23: -GTB shoulder extension and rows -Corner pec stretch   ASSESSMENT:  CLINICAL IMPRESSION: Trial of cervical retractions in supine with overpressure causes pain to centralize to right upper trap.  Decreased pain to 4/10; manual traction to cervical spine decreased pain to 3/10 with centralization of symptoms up to upper arm; feel patient will benefit from a home traction unit; encouraged patient to increase frequency of her retractions. Patient will benefit from continued skilled therapy services  to address deficits and promote return to optimal function.       Eval:  Patient is a 67 y.o. female who presents to physical therapy with complaint of neck pain. Patient demonstrates decreased strength, ROM restriction, reduced flexibility, increased tenderness to palpation and postural abnormalities which are likely contributing to symptoms of pain and are negatively impacting patient ability to perform ADLs. Patient will benefit from skilled physical therapy services to address these deficits to reduce pain and improve level of function with ADLs   OBJECTIVE IMPAIRMENTS: decreased activity tolerance, decreased mobility, decreased ROM, decreased strength, hypomobility, increased fascial restrictions, impaired flexibility, impaired UE functional use, improper body mechanics, postural dysfunction, and pain.   ACTIVITY LIMITATIONS: carrying, lifting, bending, and reach over head  PARTICIPATION LIMITATIONS: meal prep, cleaning, laundry, driving, shopping, community activity, occupation, and yard work  PERSONAL FACTORS:  NA  are also affecting patient's functional outcome.   REHAB POTENTIAL: Good  CLINICAL DECISION MAKING: Stable/uncomplicated  EVALUATION COMPLEXITY: Low   GOALS: SHORT TERM GOALS: Target date: 04/03/2023  Patient will be independent with initial HEP and self-management strategies to improve functional outcomes Baseline:   Goal status: IN PROGRESS    LONG TERM GOALS: Target date: 04/24/2023  Patient will be independent with advanced HEP and self-management strategies to improve functional outcomes Baseline:  Goal status: IN PROGRESS  2.  Patient will improve FOTO score to predicted value to indicate improvement in functional outcomes Baseline: 61% Goal status: IN PROGRESS  3.  Patient will demo improved RT cervical rotation by at least 5 degrees in order to improve ability to scan environment for safety and while driving. Baseline: See AROM Goal status: IN PROGRESS  4. Patient will report a decrease in neck pain to no more than 3/10 for improved quality of life and ability to perform UE ADLs  Baseline: 6/10 Goal status: IN PROGRESS PLAN:  PT FREQUENCY: 1-2x/week  PT DURATION: 6 weeks  PLANNED INTERVENTIONS: Therapeutic exercises, Therapeutic activity, Neuromuscular re-education, Balance training, Gait training, Patient/Family education, Joint manipulation, Joint mobilization, Stair training, Aquatic Therapy, Dry Needling, Electrical stimulation, Spinal manipulation, Spinal mobilization, Cryotherapy, Moist  heat, scar mobilization, Taping, Traction, Ultrasound, Biofeedback, Ionotophoresis 4mg /ml Dexamethasone, and Manual therapy.   PLAN FOR NEXT SESSION: F/u on chin tucks. Progress postural strengthening as tolerated. Manual as needed to address pain and restriction.  Add ER with therabands next session. Reassess and f/u on manual traction order  8:22 AM, 04/18/23 Lamia Mariner Small Teran Knittle MPT [place] [facility] [place] [phone]

## 2023-04-22 ENCOUNTER — Encounter (HOSPITAL_COMMUNITY): Payer: Self-pay | Admitting: Physical Therapy

## 2023-04-22 ENCOUNTER — Ambulatory Visit (HOSPITAL_COMMUNITY): Payer: BC Managed Care – PPO | Admitting: Physical Therapy

## 2023-04-22 DIAGNOSIS — M542 Cervicalgia: Secondary | ICD-10-CM | POA: Diagnosis not present
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
## 2023-05-27 ENCOUNTER — Ambulatory Visit: Payer: BC Managed Care – PPO | Admitting: Family Medicine

## 2023-05-27 ENCOUNTER — Encounter: Payer: Self-pay | Admitting: Family Medicine

## 2023-05-27 VITALS — BP 90/60 | HR 62 | Temp 98.4°F | Ht 67.0 in | Wt 191.0 lb

## 2023-05-27 DIAGNOSIS — T24231A Burn of second degree of right lower leg, initial encounter: Secondary | ICD-10-CM | POA: Diagnosis not present
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
## 2023-05-27 NOTE — Assessment & Plan Note (Signed)
No warmth, edema, or drainage. Instructed to keep clean with mild soap and water daily. Blister remains intact. OK to keep covered until blister ruptures. May use vaseline or Bacitracin ointment. Return to office if you begin to notice warmth, redness, swelling, or yellow drainage.

## 2023-05-31 ENCOUNTER — Ambulatory Visit: Payer: Self-pay | Admitting: *Deleted

## 2023-05-31 ENCOUNTER — Other Ambulatory Visit: Payer: Self-pay | Admitting: Family Medicine

## 2023-05-31 MED ORDER — SILVER SULFADIAZINE 1 % EX CREA: 1.0000 | TOPICAL_CREAM | Freq: Every day | CUTANEOUS | 0 refills | Status: DC

## 2023-05-31 NOTE — Telephone Encounter (Signed)
Pt advised and verbalized understanding that Dr. Tanya Nones sent in Silvadene cream to treat burn. Mjp,lpn
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
## 2023-05-31 NOTE — Telephone Encounter (Signed)
  Chief Complaint: Patient is calling to report changes to burn injury Symptoms:  Blisters have ruptured and she has been keeping area clean and covered. Patient reports she does see redness around the wound but she has been active and walking a lot at work. Discharge is clear to pale yellow- and patient is using Bacitracin ointment with medicated bandage. Some pain due to area of burn.  Pertinent Negatives: Patient denies infection at this time Disposition: [] ED /[] Urgent Care (no appt availability in office) / [x] Appointment(In office/virtual)/ []  [facility]/ [] Home Care/ [] Refused Recommended Disposition /[] [facility]/ []  Follow-up with PCP Additional Notes: Patient has appointment scheduled for Monday for wound check. Patient will continue to care for wound following care advice- if she has any changes she is aware she has access to UC virtual/in office care.

## 2023-06-03 ENCOUNTER — Encounter: Payer: Self-pay | Admitting: Family Medicine

## 2023-06-03 ENCOUNTER — Ambulatory Visit: Payer: BC Managed Care – PPO | Admitting: Family Medicine

## 2023-06-03 VITALS — BP 98/60 | HR 72 | Temp 97.7°F | Ht 67.0 in | Wt 190.0 lb

## 2023-06-03 DIAGNOSIS — T24231D Burn of second degree of right lower leg, subsequent encounter: Secondary | ICD-10-CM

## 2023-06-03 NOTE — Assessment & Plan Note (Addendum)
No redness or warmth to the surrounding tissue. There is a thin layer of yellow slough with serous drainage to the wound but it appears well healing, no odor or purulent drainage. She does have some mild non-pitting edema to the lower leg and ankle just below the burn site, again no redness or warmth. I instructed her to continue to use the Silvadene cream. For dressing she should apply xeroform guaze, dry 4x4 gauze, and wrap with kerlix daily. Once the wound stops draining and tissue turns pink may switch to vaseline and dry gauze. Healing may take several weeks. Return to office for symptoms of infection that include redness, warmth, odor, or thick, yellow drainage. Use Tylenol or Ibuprofen for pain relief.
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
## 2023-06-15 ENCOUNTER — Other Ambulatory Visit: Payer: Self-pay | Admitting: Family Medicine

## 2023-06-15 DIAGNOSIS — F32A Depression, unspecified: Secondary | ICD-10-CM

## 2023-06-15 DIAGNOSIS — F419 Anxiety disorder, unspecified: Secondary | ICD-10-CM

## 2023-06-24 ENCOUNTER — Other Ambulatory Visit: Payer: BC Managed Care – PPO

## 2023-06-24 DIAGNOSIS — E785 Hyperlipidemia, unspecified: Secondary | ICD-10-CM

## 2023-06-24 DIAGNOSIS — R790 Abnormal level of blood mineral: Secondary | ICD-10-CM

## 2023-06-24 DIAGNOSIS — Z13220 Encounter for screening for lipoid disorders: Secondary | ICD-10-CM

## 2023-06-24 LAB — CBC WITH DIFFERENTIAL/PLATELET
Absolute Monocytes: 455 cells/uL (ref 200–950)
Basophils Absolute: 21 cells/uL (ref 0–200)
Basophils Relative: 0.3 %
Eosinophils Absolute: 297 cells/uL (ref 15–500)
Eosinophils Relative: 4.3 %
HCT: 38.3 % (ref 35.0–45.0)
Hemoglobin: 12.4 g/dL (ref 11.7–15.5)
Lymphs Abs: 1794 cells/uL (ref 850–3900)
MCH: 27.4 pg (ref 27.0–33.0)
MCHC: 32.4 g/dL (ref 32.0–36.0)
MCV: 84.7 fL (ref 80.0–100.0)
MPV: 10.1 fL (ref 7.5–12.5)
Monocytes Relative: 6.6 %
Neutro Abs: 4333 cells/uL (ref 1500–7800)
Neutrophils Relative %: 62.8 %
Platelets: 298 10*3/uL (ref 140–400)
RBC: 4.52 10*6/uL (ref 3.80–5.10)
RDW: 12.9 % (ref 11.0–15.0)
Total Lymphocyte: 26 %
WBC: 6.9 10*3/uL (ref 3.8–10.8)

## 2023-07-02 ENCOUNTER — Ambulatory Visit (INDEPENDENT_AMBULATORY_CARE_PROVIDER_SITE_OTHER): Payer: BC Managed Care – PPO | Admitting: Family Medicine

## 2023-07-02 ENCOUNTER — Encounter: Payer: Self-pay | Admitting: Family Medicine

## 2023-07-02 VITALS — BP 126/72 | HR 56 | Temp 97.6°F | Ht 67.0 in | Wt 184.0 lb

## 2023-07-02 DIAGNOSIS — Z23 Encounter for immunization: Secondary | ICD-10-CM

## 2023-07-02 DIAGNOSIS — Z0000 Encounter for general adult medical examination without abnormal findings: Secondary | ICD-10-CM | POA: Diagnosis not present

## 2023-07-02 NOTE — Progress Notes (Signed)
Subjective:    Patient ID: Valerie Stuart, female    DOB: 1956/08/11, 67 y.o.   MRN: [id number]  HPI Patient is a very pleasant 67 year old African-American female who is here today for a CPE.  Colonoscopy was 2023 and mammogram was 11/23.  Patient states she had a colonoscopy within the last year that was normal.  She is not yet due for a bone density.  She is due for the second shingles shot.  She is not due for the pneumonia vaccine until after 67.  She is due for a flu shot this fall.  Her most recent lab work is listed below with a significant mild elevation alkaline phosphatase and low calcium Lab on 06/24/2023  Component Date Value Ref Range Status   WBC 06/24/2023 6.9  3.8 - 10.8 Thousand/uL Final   RBC 06/24/2023 4.52  3.80 - 5.10 Million/uL Final   Hemoglobin 06/24/2023 12.4  11.7 - 15.5 g/dL Final   HCT 40/98/1191 38.3  35.0 - 45.0 % Final   MCV 06/24/2023 84.7  80.0 - 100.0 fL Final   MCH 06/24/2023 27.4  27.0 - 33.0 pg Final   MCHC 06/24/2023 32.4  32.0 - 36.0 g/dL Final   RDW 47/82/9562 12.9  11.0 - 15.0 % Final   Platelets 06/24/2023 298  140 - 400 Thousand/uL Final   MPV 06/24/2023 10.1  7.5 - 12.5 fL Final   Neutro Abs 06/24/2023 4,333  1,500 - 7,800 cells/uL Final   Lymphs Abs 06/24/2023 1,794  850 - 3,900 cells/uL Final   Absolute Monocytes 06/24/2023 455  200 - 950 cells/uL Final   Eosinophils Absolute 06/24/2023 297  15 - 500 cells/uL Final   Basophils Absolute 06/24/2023 21  0 - 200 cells/uL Final   Neutrophils Relative % 06/24/2023 62.8  % Final   Total Lymphocyte 06/24/2023 26.0  % Final   Monocytes Relative 06/24/2023 6.6  % Final   Eosinophils Relative 06/24/2023 4.3  % Final   Basophils Relative 06/24/2023 0.3  % Final   Glucose, Bld 06/24/2023 77  65 - 99 mg/dL Final   Comment: .            Fasting reference interval .    BUN 06/24/2023 14  7 - 25 mg/dL Final   Creat 13/06/6577 0.93  0.50 - 1.05 mg/dL Final   eGFR 46/96/2952 69  > OR = 60  mL/min/1.20m2 Final   BUN/Creatinine Ratio 06/24/2023 SEE NOTE:  6 - 22 (calc) Final   Comment:    Not Reported: BUN and Creatinine are within    reference range. .    Sodium 06/24/2023 143  135 - 146 mmol/L Final   Potassium 06/24/2023 3.8  3.5 - 5.3 mmol/L Final   Chloride 06/24/2023 110  98 - 110 mmol/L Final   CO2 06/24/2023 24  20 - 32 mmol/L Final   Calcium 06/24/2023 8.4 (L)  8.6 - 10.4 mg/dL Final   Total Protein 84/13/2440 6.9  6.1 - 8.1 g/dL Final   Albumin 09/08/2535 4.0  3.6 - 5.1 g/dL Final   Globulin 64/40/3474 2.9  1.9 - 3.7 g/dL (calc) Final   AG Ratio 06/24/2023 1.4  1.0 - 2.5 (calc) Final   Total Bilirubin 06/24/2023 0.4  0.2 - 1.2 mg/dL Final   Alkaline phosphatase (APISO) 06/24/2023 172 (H)  37 - 153 U/L Final   AST 06/24/2023 19  10 - 35 U/L Final   ALT 06/24/2023 11  6 - 29 U/L Final  Cholesterol 06/24/2023 147  <200 mg/dL Final   HDL 72/53/6644 50  > OR = 50 mg/dL Final   Triglycerides 03/47/4259 62  <150 mg/dL Final   LDL Cholesterol (Calc) 06/24/2023 83  mg/dL (calc) Final   Comment: Reference range: <100 . Desirable range <100 mg/dL for primary prevention;   <70 mg/dL for patients with CHD or diabetic patients  with > or = 2 CHD risk factors. Marland Kitchen LDL-C is now calculated using the Martin-Hopkins  calculation, which is a validated novel method providing  better accuracy than the Friedewald equation in the  estimation of LDL-C.  Horald Pollen et al. Lenox Ahr. 5638;756(43): 2061-2068  (http://education.[facility].com/faq/FAQ164)    Total CHOL/HDL Ratio 06/24/2023 2.9  <3.2 (calc) Final   Non-HDL Cholesterol (Calc) 06/24/2023 97  <130 mg/dL (calc) Final   Comment: For patients with diabetes plus 1 major ASCVD risk  factor, treating to a non-HDL-C goal of <100 mg/dL  (LDL-C of <95 mg/dL) is considered a therapeutic  option.     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 Per pt get meds form compound pharmacy--[facility] inject 10 units UNDER THE SKIN ONCE a WEEK FOR 4 WEEKS (THEN FOLLOWING UP with Dr Tanya Nones FOR DOSE changes)     silver sulfADIAZINE (SILVADENE) 1 % cream Apply 1 Application topically daily. 50 g 0   vortioxetine HBr (TRINTELLIX) 5 MG TABS tablet Take 1 tablet by mouth once daily 30 tablet 0   No current facility-administered medications on file prior to visit.   Allergies  Allergen Reactions   Bee Venom Anaphylaxis   Social History   Socioeconomic History   Marital status: Married    Spouse name: Not on file   Number of children: Not on file   Years of education: Not on file   Highest education level: Not on file  Occupational History   Occupation: Checks group homes for compliance    Employer: [employer]  Tobacco Use   Smoking status: Never   Smokeless tobacco: Never  Vaping Use   Vaping status: Never Used  Substance and Sexual Activity   Alcohol use: No   Drug use: No   Sexual activity: Yes  Other Topics Concern   Not on file  Social History Narrative   Not on file   Social Determinants of Health   Financial Resource Strain: Not on file  Food Insecurity: Not on file  Transportation  Needs: Not on file  Physical Activity: Not on file  Stress: Not on file  Social Connections: Not on file  Intimate Partner Violence: Not on file     Review of  Systems  All other systems reviewed and are negative.      Objective:   Physical Exam Vitals reviewed.  Constitutional:      Appearance: Normal appearance. She is normal weight.  Cardiovascular:     Rate and Rhythm: Normal rate and regular rhythm.     Heart sounds: Normal heart sounds. No murmur heard.    No friction rub. No gallop.  Pulmonary:     Effort: Pulmonary effort is normal. No respiratory distress.     Breath sounds: Normal breath sounds. No stridor. No wheezing or rales.  Musculoskeletal:     Right lower leg: No edema.     Left lower leg: No edema.  Neurological:     General: No focal deficit present.     Mental Status: She is alert and oriented to person, place, and time. Mental status is at baseline.  Psychiatric:        Mood and Affect: Mood normal.        Behavior: Behavior normal.        Thought Content: Thought content normal.           Assessment & Plan:  Need for shingles vaccine - Plan: Varicella-zoster vaccine IM  General medical exam Physical exam today is normal.  Mammogram is up-to-date, colonoscopy is up-to-date, bone density and pneumonia vaccine are due next year.  The patient received her shingles vaccine #2 today.  Recommended a flu shot and a COVID shot in the fall.  Labs were normal except for elevated alkaline phosphatase which I believe may be due to increased bone turnover secondary to hypocalcemia.  Recommended to start L-theanine 100 mg a.m. and D1 tablets a day and then repeating a CMP and a vitamin D level in 1 month

## 2023-08-05 ENCOUNTER — Encounter: Payer: Self-pay | Admitting: Family Medicine

## 2023-09-22 ENCOUNTER — Other Ambulatory Visit: Payer: Self-pay | Admitting: Family Medicine

## 2023-09-22 DIAGNOSIS — F32A Depression, unspecified: Secondary | ICD-10-CM

## 2023-09-22 DIAGNOSIS — F419 Anxiety disorder, unspecified: Secondary | ICD-10-CM

## 2023-09-24 NOTE — Telephone Encounter (Signed)
Requested medication (s) are due for refill today: yes  Requested medication (s) are on the active medication list: yes  Last refill:  06/17/23  Future visit scheduled: no  Notes to clinic:  Unable to refill per protocol, courtesy refill already given, routing for provider approval.      Requested Prescriptions  Pending Prescriptions Disp Refills   TRINTELLIX 5 MG TABS tablet [Pharmacy Med Name: Trintellix 5 MG Oral Tablet] 90 tablet 0    Sig: Take 1 tablet by mouth once daily     Psychiatry: Antidepressants - Serotonin Modulator Failed - 09/22/2023 10:48 AM      Failed - Valid encounter within last 6 months    Recent Outpatient Visits           1 year ago Depression, unspecified depression type   [facility] Pickard, Priscille Heidelberg, MD   1 year ago Anxiety   [facility] Valentino Nose, NP   2 years ago Encounter to establish care   [facility] Valentino Nose, NP              Passed - Completed PHQ-2 or PHQ-9 in the last 360 days       buPROPion ER (WELLBUTRIN SR) 100 MG 12 hr tablet [Pharmacy Med Name: BUPROPION ER / SR 100MG  TAB] 60 tablet 0    Sig: TAKE 1 TABLET BY MOUTH TWICE DAILY . APPOINTMENT REQUIRED FOR FUTURE REFILLS     Psychiatry: Antidepressants - bupropion Failed - 09/22/2023 10:48 AM      Failed - Valid encounter within last 6 months    Recent Outpatient Visits           1 year ago Depression, unspecified depression type   [facility] Donita Brooks, MD   1 year ago Anxiety   [facility] Valentino Nose, NP   2 years ago Encounter to establish care   [facility] Valentino Nose, NP              Passed - Cr in normal range and within 360 days    Creat  Date Value Ref Range Status  06/24/2023 0.93 0.50 - 1.05 mg/dL Final         Passed - AST in normal range and within 360 days    AST  Date Value Ref Range Status  06/24/2023  19 10 - 35 U/L Final         Passed - ALT in normal range and within 360 days    ALT  Date Value Ref Range Status  06/24/2023 11 6 - 29 U/L Final         Passed - Completed PHQ-2 or PHQ-9 in the last 360 days      Passed - Last BP in normal range    BP Readings from Last 1 Encounters:  07/02/23 126/72

## 2023-09-26 LAB — HM MAMMOGRAPHY

## 2023-10-01 ENCOUNTER — Other Ambulatory Visit: Payer: Self-pay | Admitting: Family Medicine

## 2023-10-01 DIAGNOSIS — F419 Anxiety disorder, unspecified: Secondary | ICD-10-CM

## 2023-10-01 DIAGNOSIS — F32A Depression, unspecified: Secondary | ICD-10-CM

## 2023-10-02 ENCOUNTER — Other Ambulatory Visit: Payer: Self-pay

## 2023-10-02 DIAGNOSIS — F419 Anxiety disorder, unspecified: Secondary | ICD-10-CM

## 2023-10-02 DIAGNOSIS — F32A Depression, unspecified: Secondary | ICD-10-CM

## 2023-10-02 NOTE — Telephone Encounter (Signed)
Prescription Request  10/02/2023  LOV: 06/24/23  What is the name of the medication or equipment? vortioxetine HBr (TRINTELLIX) 5 MG TABS tablet [[id number]]   Have you contacted your pharmacy to request a refill? Yes   Which pharmacy would you like this sent to?  [facility] [address], [place] - [address] [place] [address] Phone: [phone] Fax: [phone]    Patient notified that their request is being sent to the clinical staff for review and that they should receive a response within 2 business days.   Please advise at [facility] [phone]

## 2023-10-03 MED ORDER — VORTIOXETINE HBR 5 MG PO TABS: 5.0000 mg | ORAL_TABLET | Freq: Every day | ORAL | 1 refills | Status: DC

## 2023-10-03 NOTE — Telephone Encounter (Signed)
Called pt to schedule Annual physical. Pt stated she had CPE 07/02/23 and verified that was correct. The encounter was labed "need for Shingles Vaccine". Thanked pt for her understanding.

## 2023-10-06 ENCOUNTER — Other Ambulatory Visit: Payer: Self-pay | Admitting: Family Medicine

## 2023-10-09 ENCOUNTER — Other Ambulatory Visit: Payer: Self-pay

## 2023-10-09 ENCOUNTER — Telehealth: Payer: Self-pay

## 2023-10-09 DIAGNOSIS — F32A Depression, unspecified: Secondary | ICD-10-CM

## 2023-10-09 MED ORDER — BUPROPION HCL ER (SR) 100 MG PO TB12: 100.0000 mg | ORAL_TABLET | Freq: Two times a day (BID) | ORAL | 0 refills | Status: DC

## 2023-10-09 NOTE — Telephone Encounter (Signed)
Copied from CRM [id number]. Topic: Clinical - Medication Question >> Oct 09, 2023  9:35 AM Alona Bene A wrote: Reason for CRM: Patient reached out regarding medication refill for buPROPion ER (WELLBUTRIN SR) 100 MG 12 hr tablet. Patient is out of medication and is in need of medication. Please provide update to patient through MyChart.

## 2024-01-19 ENCOUNTER — Other Ambulatory Visit: Payer: Self-pay | Admitting: Family Medicine

## 2024-01-19 DIAGNOSIS — F32A Depression, unspecified: Secondary | ICD-10-CM

## 2024-01-21 NOTE — Telephone Encounter (Signed)
 Due 6 month medication follow up- attempted to call patient- no answer- left message to call office. Courtesy 30 day refill given Requested Prescriptions  Pending Prescriptions Disp Refills   buPROPion ER (WELLBUTRIN SR) 100 MG 12 hr tablet [Pharmacy Med Name: BUPROPION ER / SR 100MG  TAB] 60 tablet 0    Sig: Take 1 tablet by mouth twice daily     Psychiatry: Antidepressants - bupropion Failed - 01/21/2024  9:26 AM      Failed - Valid encounter within last 6 months    Recent Outpatient Visits           1 year ago Depression, unspecified depression type   [facility] Donita Brooks, MD   2 years ago Anxiety   [facility] Valentino Nose, NP   2 years ago Encounter to establish care   [facility] Valentino Nose, NP              Passed - Cr in normal range and within 360 days    Creat  Date Value Ref Range Status  06/24/2023 0.93 0.50 - 1.05 mg/dL Final         Passed - AST in normal range and within 360 days    AST  Date Value Ref Range Status  06/24/2023 19 10 - 35 U/L Final         Passed - ALT in normal range and within 360 days    ALT  Date Value Ref Range Status  06/24/2023 11 6 - 29 U/L Final         Passed - Completed PHQ-2 or PHQ-9 in the last 360 days      Passed - Last BP in normal range    BP Readings from Last 1 Encounters:  07/02/23 126/72

## 2024-02-07 ENCOUNTER — Ambulatory Visit: Payer: Self-pay | Admitting: Family Medicine

## 2024-02-17 ENCOUNTER — Ambulatory Visit: Payer: Self-pay | Admitting: Family Medicine

## 2024-02-17 VITALS — BP 120/64 | HR 73 | Temp 98.1°F | Ht 67.0 in | Wt 171.2 lb

## 2024-02-17 DIAGNOSIS — R7989 Other specified abnormal findings of blood chemistry: Secondary | ICD-10-CM | POA: Diagnosis not present

## 2024-02-17 DIAGNOSIS — Z780 Asymptomatic menopausal state: Secondary | ICD-10-CM

## 2024-02-17 MED ORDER — BUSPIRONE HCL 7.5 MG PO TABS: 7.5000 mg | ORAL_TABLET | Freq: Two times a day (BID) | ORAL | 3 refills | Status: DC

## 2024-02-17 NOTE — Progress Notes (Signed)
 Subjective:    Patient ID: Valerie Stuart, female    DOB: 12-28-1955, 68 y.o.   MRN: [id number]  HPI Patient is here today for a checkup.  She is currently on Trintellix 5 mg a day with Wellbutrin.  She is seeing a therapist because she feels her anxiety is worse.  Also has insomnia.  Does not feel trintellix is helpful.  Last few labs have shown low calcium and elevated alk phos concerning for elevated bone turnover.     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 Per pt get meds form compound pharmacy--[facility] inject 10 units UNDER THE SKIN ONCE a WEEK FOR 4 WEEKS (THEN FOLLOWING UP with Dr Tanya Nones FOR DOSE changes)     silver sulfADIAZINE (SILVADENE) 1 % cream Apply 1 Application topically daily. 50 g 0   vortioxetine HBr (TRINTELLIX) 5 MG TABS tablet Take 1 tablet (5 mg total) by mouth daily. 90 tablet 1   No current facility-administered medications on file prior to visit.   Allergies  Allergen Reactions   Bee Venom Anaphylaxis   Social History   Socioeconomic History   Marital status: Married    Spouse name: Not on file   Number of children: Not on file   Years of  education: Not on file   Highest education level: Master's degree (e.g., MA, MS, MEng, MEd, MSW, MBA)  Occupational History   Occupation: Checks group homes for compliance    Employer: [employer]  Tobacco Use   Smoking status: Never   Smokeless tobacco: Never  Vaping Use   Vaping status: Never Used  Substance and Sexual Activity   Alcohol use: No   Drug use: No   Sexual activity: Yes  Other Topics Concern   Not on file  Social History Narrative   Not on file   Social Drivers of Health   Financial Resource Strain: Low Risk  (02/17/2024)   Overall Financial Resource Strain (CARDIA)    Difficulty of Paying Living Expenses: Not hard at all  Food Insecurity: No Food Insecurity (02/17/2024)   Hunger Vital Sign    Worried About Running Out of Food in the Last Year: Never true    Ran Out of Food in the Last Year: Never true  Transportation Needs: No Transportation Needs (02/17/2024)   PRAPARE - Administrator, Civil Service (Medical): No    Lack of Transportation (Non-Medical): No  Physical Activity: Insufficiently Active (02/17/2024)   Exercise Vital Sign    Days of Exercise per Week: 3 days    Minutes of Exercise per Session: 30 min  Stress:  Stress Concern Present (02/17/2024)   [employer] of Occupational Health - Occupational Stress Questionnaire    Feeling of Stress : Very much  Social Connections: Moderately Integrated (02/17/2024)   Social Connection and Isolation Panel [NHANES]    Frequency of Communication with Friends and Family: More than three times a week    Frequency of Social Gatherings with Friends and Family: More than three times a week    Attends Religious Services: 1 to 4 times per year    Active Member of [employer] or Organizations: No    Attends Engineer, structural: Not on file    Marital Status: Married  Catering manager Violence: Not on file     Review of Systems  All other systems reviewed and are negative.       Objective:   Physical Exam Vitals reviewed.  Constitutional:      Appearance: Normal appearance. She is normal weight.  Cardiovascular:     Rate and Rhythm: Normal rate and regular rhythm.     Heart sounds: Normal heart sounds. No murmur heard.    No friction rub. No gallop.  Pulmonary:     Effort: Pulmonary effort is normal. No respiratory distress.     Breath sounds: Normal breath sounds. No stridor. No wheezing or rales.  Musculoskeletal:     Right lower leg: No edema.     Left lower leg: No edema.  Neurological:     General: No focal deficit present.     Mental Status: She is alert and oriented to person, place, and time. Mental status is at baseline.  Psychiatric:        Mood and Affect: Mood normal.        Behavior: Behavior normal.        Thought Content: Thought content normal.           Assessment & Plan:  Postmenopausal estrogen deficiency - Plan: DG Bone Density  Low serum calcium - Plan: CBC with Differential/Platelet, COMPLETE METABOLIC PANEL WITHOUT GFR, VITAMIN D 25 Hydroxy (Vit-D Deficiency, Fractures) Check bone density.  Add calcium 1200 poqday and vit d 1000 poqday.  Repeat cmp and cbc.  Add buspar 7.5 mg pobid and recheck in 4 weeks.  If better from anxiety standpoint wean off trintellix.

## 2024-02-18 ENCOUNTER — Other Ambulatory Visit: Payer: Self-pay

## 2024-02-18 LAB — COMPLETE METABOLIC PANEL WITHOUT GFR
AG Ratio: 1.5 (calc) (ref 1.0–2.5)
ALT: 27 U/L (ref 6–29)
AST: 28 U/L (ref 10–35)
Albumin: 4.2 g/dL (ref 3.6–5.1)
Alkaline phosphatase (APISO): 173 U/L — ABNORMAL HIGH (ref 37–153)
BUN: 12 mg/dL (ref 7–25)
CO2: 26 mmol/L (ref 20–32)
Calcium: 8.9 mg/dL (ref 8.6–10.4)
Chloride: 109 mmol/L (ref 98–110)
Creat: 0.9 mg/dL (ref 0.50–1.05)
Globulin: 2.8 g/dL (ref 1.9–3.7)
Glucose, Bld: 76 mg/dL (ref 65–99)
Potassium: 3.8 mmol/L (ref 3.5–5.3)
Sodium: 142 mmol/L (ref 135–146)
Total Bilirubin: 0.5 mg/dL (ref 0.2–1.2)
Total Protein: 7 g/dL (ref 6.1–8.1)

## 2024-02-18 LAB — CBC WITH DIFFERENTIAL/PLATELET
Absolute Lymphocytes: 1608 {cells}/uL (ref 850–3900)
Absolute Monocytes: 421 {cells}/uL (ref 200–950)
Basophils Absolute: 28 {cells}/uL (ref 0–200)
Basophils Relative: 0.4 %
Eosinophils Absolute: 269 {cells}/uL (ref 15–500)
Eosinophils Relative: 3.9 %
HCT: 38.4 % (ref 35.0–45.0)
Hemoglobin: 12.6 g/dL (ref 11.7–15.5)
MCH: 28.1 pg (ref 27.0–33.0)
MCHC: 32.8 g/dL (ref 32.0–36.0)
MCV: 85.7 fL (ref 80.0–100.0)
MPV: 10.3 fL (ref 7.5–12.5)
Monocytes Relative: 6.1 %
Neutro Abs: 4575 {cells}/uL (ref 1500–7800)
Neutrophils Relative %: 66.3 %
Platelets: 294 10*3/uL (ref 140–400)
RBC: 4.48 10*6/uL (ref 3.80–5.10)
RDW: 12.6 % (ref 11.0–15.0)
Total Lymphocyte: 23.3 %
WBC: 6.9 10*3/uL (ref 3.8–10.8)

## 2024-02-18 LAB — VITAMIN D 25 HYDROXY (VIT D DEFICIENCY, FRACTURES): Vit D, 25-Hydroxy: 10 ng/mL — ABNORMAL LOW (ref 30–100)

## 2024-02-18 MED ORDER — VITAMIN D (ERGOCALCIFEROL) 1.25 MG (50000 UNIT) PO CAPS: 50000.0000 [IU] | ORAL_CAPSULE | ORAL | 0 refills | Status: AC

## 2024-04-04 ENCOUNTER — Other Ambulatory Visit: Payer: Self-pay | Admitting: Family Medicine

## 2024-04-04 DIAGNOSIS — F32A Depression, unspecified: Secondary | ICD-10-CM
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
## 2024-05-08 ENCOUNTER — Encounter: Payer: Self-pay | Admitting: Emergency Medicine

## 2024-05-08 ENCOUNTER — Ambulatory Visit: Payer: Self-pay

## 2024-05-08 ENCOUNTER — Ambulatory Visit
Admission: EM | Admit: 2024-05-08 | Discharge: 2024-05-08 | Disposition: A | Discharge status: Home / Self Care | Attending: Family Medicine | Admitting: Family Medicine

## 2024-05-08 DIAGNOSIS — R3129 Other microscopic hematuria: Secondary | ICD-10-CM

## 2024-05-08 DIAGNOSIS — R109 Unspecified abdominal pain: Secondary | ICD-10-CM | POA: Diagnosis present

## 2024-05-08 DIAGNOSIS — R82998 Other abnormal findings in urine: Secondary | ICD-10-CM

## 2024-05-08 DIAGNOSIS — R1030 Lower abdominal pain, unspecified: Secondary | ICD-10-CM | POA: Diagnosis present

## 2024-05-08 LAB — POCT URINALYSIS DIP (MANUAL ENTRY)
Bilirubin, UA: NEGATIVE
Glucose, UA: NEGATIVE mg/dL
Ketones, POC UA: NEGATIVE mg/dL
Nitrite, UA: NEGATIVE
Protein Ur, POC: NEGATIVE mg/dL
Spec Grav, UA: 1.025 (ref 1.010–1.025)
Urobilinogen, UA: 1 U/dL
pH, UA: 5.5 (ref 5.0–8.0)

## 2024-05-08 MED ORDER — CIPROFLOXACIN HCL 500 MG PO TABS: 500.0000 mg | ORAL_TABLET | Freq: Two times a day (BID) | ORAL | 0 refills | Status: DC
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
## 2024-05-08 NOTE — Telephone Encounter (Signed)
 FYI Only or Action Required?: FYI only for provider.  Patient was last seen in primary care on 02/17/2024 by Duanne Butler DASEN, MD. Called Nurse Triage reporting Gastroesophageal Reflux. Symptoms began today. Interventions attempted: OTC medications: Gas-X and Other: Club Soda. Symptoms are: severe chest pain along bra line radiating down to abdomen and wrapping around to her back, belching, difficulty catching her breath, painful to take a deep breath gradually worsening.  Triage Disposition: Call EMS 911 Now- Refused EMS but states she will chew aspirin and have her husband take her to ED  Patient/caregiver understands and will follow disposition?:  Yes                     Copied from CRM [id number]. Topic: Clinical - Red Word Triage >> May 08, 2024 11:17 AM Ivette P wrote: Red Word that prompted transfer to Nurse Triage: feels like a really bad case of gas or indigestion. Unable to stand. Going on for 3 hours.   From breast bone, through stomach to the back. Feels like gas. Reason for Disposition  [1] Chest pain lasts > 5 minutes AND [2] age > 19  Answer Assessment - Initial Assessment Questions Patient states she had a slice of pizza for dinner and last night she had a donut and ginger ale before bed. Patient states she has not taken her GLP-1 injection in 2 weeks.  1. LOCATION: Where does it hurt?       Center of chest near breast bone.  2. RADIATION: Does the pain go anywhere else? (e.g., into neck, jaw, arms, back)     Down toward abdomen and into back.  3. ONSET: When did the chest pain begin? (Minutes, hours or days)      Late last night, early this morning and then resolved. Constant since 0800.  4. PATTERN: Does the pain come and go, or has it been constant since it started?  Does it get worse with exertion?      Comes and goes and then constant.  5. DURATION: How long does it last (e.g., seconds, minutes, hours)     3/5 hours.  6. SEVERITY:  How bad is the pain?  (e.g., Scale 1-10; mild, moderate, or severe)    - MILD (1-3): doesn't interfere with normal activities     - MODERATE (4-7): interferes with normal activities or awakens from sleep    - SEVERE (8-10): excruciating pain, unable to do any normal activities       Discomfort, pressure. 8/10.  7. CARDIAC RISK FACTORS: Do you have any history of heart problems or risk factors for heart disease? (e.g., angina, prior heart attack; diabetes, high blood pressure, high cholesterol, smoker, or strong family history of heart disease)     Hyperlipidemia.  8. PULMONARY RISK FACTORS: Do you have any history of lung disease?  (e.g., blood clots in lung, asthma, emphysema, birth control pills)     OSA.  9. CAUSE: What do you think is causing the chest pain?     Feels like really bad gas or indigestion. She states she drank club soda and took Gas X.   10. OTHER SYMPTOMS: Do you have any other symptoms? (e.g., dizziness, nausea, vomiting, sweating, fever, difficulty breathing, cough)       Belching, difficulty catching her breath, painful to take a deep breath. Patient denies dizziness, nausea, vomiting, sweating.  11. PREGNANCY: Is there any chance you are pregnant? When was your last menstrual period?  N/A.  Protocols used: Chest Pain-A-AH

## 2024-05-08 NOTE — Discharge Instructions (Signed)
 Your urine test today showed some bacteria and microscopic blood, this could be indicative of either a kidney stone or a urinary tract infection though as discussed these do not perfectly fit with your symptoms so I am unsure that this is truly the cause of your abdominal and low back pain.  I have sent out for a urine culture for further evaluation and started you on an antibiotic for a possible urinary tract infection while we await both the urine culture and lab results.  You may also use heating pads, over-the-counter pain relievers.  If your symptoms continue to worsen go to the emergency department for further evaluation

## 2024-05-08 NOTE — ED Triage Notes (Addendum)
 Stomach pain started this morning.  States pain radiates to back.  States took gas x without relief.  Last BM was yesterday and states was normal.

## 2024-05-09 LAB — URINE CULTURE: Culture: NO GROWTH

## 2024-05-09 LAB — COMPREHENSIVE METABOLIC PANEL WITH GFR
ALT: 16 IU/L (ref 0–32)
AST: 27 IU/L (ref 0–40)
Albumin: 4.1 g/dL (ref 3.9–4.9)
Alkaline Phosphatase: 175 IU/L — ABNORMAL HIGH (ref 44–121)
BUN/Creatinine Ratio: 18 (ref 12–28)
BUN: 14 mg/dL (ref 8–27)
Bilirubin Total: <0.2 mg/dL (ref 0.0–1.2)
CO2: 19 mmol/L — ABNORMAL LOW (ref 20–29)
Calcium: 8.7 mg/dL (ref 8.7–10.3)
Chloride: 107 mmol/L — ABNORMAL HIGH (ref 96–106)
Creatinine, Ser: 0.8 mg/dL (ref 0.57–1.00)
Globulin, Total: 2.9 g/dL (ref 1.5–4.5)
Glucose: 89 mg/dL (ref 70–99)
Potassium: 3.8 mmol/L (ref 3.5–5.2)
Sodium: 143 mmol/L (ref 134–144)
Total Protein: 7 g/dL (ref 6.0–8.5)
eGFR: 82 mL/min/{1.73_m2} (ref >59)

## 2024-05-09 LAB — CBC WITH DIFFERENTIAL/PLATELET
Basophils Absolute: 0 10*3/uL (ref 0.0–0.2)
Basos: 0 %
EOS (ABSOLUTE): 0.2 10*3/uL (ref 0.0–0.4)
Eos: 2 %
Hematocrit: 33.7 % — ABNORMAL LOW (ref 34.0–46.6)
Hemoglobin: 10.6 g/dL — ABNORMAL LOW (ref 11.1–15.9)
Immature Grans (Abs): 0 10*3/uL (ref 0.0–0.1)
Immature Granulocytes: 0 %
Lymphocytes Absolute: 1.5 10*3/uL (ref 0.7–3.1)
Lymphs: 22 %
MCH: 27.6 pg (ref 26.6–33.0)
MCHC: 31.5 g/dL (ref 31.5–35.7)
MCV: 88 fL (ref 79–97)
Monocytes Absolute: 0.4 10*3/uL (ref 0.1–0.9)
Monocytes: 5 %
Neutrophils Absolute: 4.9 10*3/uL (ref 1.4–7.0)
Neutrophils: 71 %
Platelets: 268 10*3/uL (ref 150–450)
RBC: 3.84 x10E6/uL (ref 3.77–5.28)
RDW: 13.4 % (ref 11.7–15.4)
WBC: 7 10*3/uL (ref 3.4–10.8)

## 2024-05-09 LAB — LIPASE: Lipase: 21 U/L (ref 14–72)

## 2024-05-11 ENCOUNTER — Ambulatory Visit (HOSPITAL_COMMUNITY): Payer: Self-pay

## 2024-07-09 ENCOUNTER — Ambulatory Visit: Admitting: Family Medicine

## 2024-07-09 ENCOUNTER — Encounter: Payer: Self-pay | Admitting: Family Medicine

## 2024-07-09 VITALS — BP 118/68 | HR 58 | Temp 97.9°F | Ht 67.0 in | Wt 176.4 lb

## 2024-07-09 DIAGNOSIS — D649 Anemia, unspecified: Secondary | ICD-10-CM

## 2024-07-09 DIAGNOSIS — F419 Anxiety disorder, unspecified: Secondary | ICD-10-CM

## 2024-07-09 DIAGNOSIS — F32A Depression, unspecified: Secondary | ICD-10-CM

## 2024-07-09 MED ORDER — BUSPIRONE HCL 7.5 MG PO TABS: 15.0000 mg | ORAL_TABLET | Freq: Two times a day (BID) | ORAL | 3 refills | Status: AC

## 2024-07-09 MED ORDER — EPINEPHRINE 0.3 MG/0.3ML IJ SOAJ: 0.3000 mg | INTRAMUSCULAR | 3 refills | Status: AC | PRN

## 2024-07-09 MED ORDER — VORTIOXETINE HBR 5 MG PO TABS: 5.0000 mg | ORAL_TABLET | Freq: Every day | ORAL | 1 refills | Status: AC

## 2024-07-09 NOTE — Progress Notes (Signed)
 Subjective:    Patient ID: Valerie Stuart, female    DOB: 14-Jan-1956, 68 y.o.   MRN: [id number]  HPI Patient had an episode of intense pain in June prompting her to go to an urgent care.  Workup in the urgent care did show anemia.  Hemoglobin did drop from 12-10.  Patient was having intense lower abdominal pain.  This resolved.  I think the patient was either having intestinal spasms or diverticulitis however she is pain-free at the present time.  She is still dealing with anxiety and seeing a therapist.  She has not seen substantial benefit from the BuSpar  7.5 mg twice daily Past Medical History:  Diagnosis Date   Anxiety    Bradycardia, unspecified 06/06/2017   Symptomatic bradycardia   Colon cancer screening 09/12/2021   Depression    Fatigue 06/06/2017   Hyperlipidemia     Past Surgical History:  Procedure Laterality Date   BREATH TEK H PYLORI N/A 04/22/2015   Procedure: BREATH TEK H PYLORI;  Surgeon: Camellia Blush, MD;  Location: [facility];  Service: General;  Laterality: N/A;   CHOLECYSTECTOMY  2017   HERNIA REPAIR  2017   LAPAROSCOPIC GASTRIC RESTRICTIVE DUODENAL PROCEDURE (DUODENAL SWITCH)  2017   with bilio-pancreatic diversion   TONSILLECTOMY     Current Outpatient Medications on File Prior to Visit  Medication Sig Dispense Refill   Vitamin D , Ergocalciferol , (DRISDOL ) 1.25 MG (50000 UNIT) CAPS capsule Take 1 capsule (50,000 Units total) by mouth every 7 (seven) days. 26 capsule 0   No current facility-administered medications on file prior to visit.   Allergies  Allergen Reactions   Bee Venom Anaphylaxis   Social History   Socioeconomic History   Marital status: Married    Spouse name: Not on file   Number of children: Not on file   Years of education: Not on file   Highest education level: Master's degree (e.g., MA, MS, MEng, MEd, MSW, MBA)  Occupational History   Occupation: Checks group homes for compliance    Employer: [employer]    Tobacco Use   Smoking status: Never   Smokeless tobacco: Never  Vaping Use   Vaping status: Never Used  Substance and Sexual Activity   Alcohol use: No   Drug use: No   Sexual activity: Yes  Other Topics Concern   Not on file  Social History Narrative   Not on file   Social Drivers of Health   Financial Resource Strain: Low Risk  (02/17/2024)   Overall Financial Resource Strain (CARDIA)    Difficulty of Paying Living Expenses: Not hard at all  Food Insecurity: No Food Insecurity (02/17/2024)   Hunger Vital Sign    Worried About Running Out of Food in the Last Year: Never true    Ran Out of Food in the Last Year: Never true  Transportation Needs: No Transportation Needs (02/17/2024)   PRAPARE - Administrator, Civil Service (Medical): No    Lack of Transportation (Non-Medical): No  Physical Activity: Insufficiently Active (02/17/2024)   Exercise Vital Sign    Days of Exercise per Week: 3 days    Minutes of Exercise per Session: 30 min  Stress: Stress Concern Present (02/17/2024)   [employer] of Occupational Health - Occupational Stress Questionnaire    Feeling of Stress : Very much  Social Connections: Moderately Integrated (02/17/2024)   Social Connection and Isolation Panel    Frequency of Communication with Friends and Family: More than  three times a week    Frequency of Social Gatherings with Friends and Family: More than three times a week    Attends Religious Services: 1 to 4 times per year    Active Member of [employer] or Organizations: No    Attends Engineer, structural: Not on file    Marital Status: Married  Catering manager Violence: Not on file     Review of Systems  All other systems reviewed and are negative.      Objective:   Physical Exam Vitals reviewed.  Constitutional:      Appearance: Normal appearance. She is normal weight.  Cardiovascular:     Rate and Rhythm: Normal rate and regular rhythm.     Heart sounds: Normal heart  sounds. No murmur heard.    No friction rub. No gallop.  Pulmonary:     Effort: Pulmonary effort is normal. No respiratory distress.     Breath sounds: Normal breath sounds. No stridor. No wheezing or rales.  Musculoskeletal:     Right lower leg: No edema.     Left lower leg: No edema.  Neurological:     General: No focal deficit present.     Mental Status: She is alert and oriented to person, place, and time. Mental status is at baseline.  Psychiatric:        Mood and Affect: Mood normal.        Behavior: Behavior normal.        Thought Content: Thought content normal.           Assessment & Plan:  Anemia, unspecified type - Plan: CBC with Differential/Platelet, Comprehensive metabolic panel with GFR, Vitamin B12, Iron, Fecal Globin By Immunochemistry  Anxiety - Plan: vortioxetine  HBr (TRINTELLIX ) 5 MG TABS tablet  Depression, unspecified depression type - Plan: vortioxetine  HBr (TRINTELLIX ) 5 MG TABS tablet Increase BuSpar  to 15 mg twice daily.  Continue Trintellix  5 mg daily until stable.  Workup anemia by checking an iron level, a B12 level, and a stool test for blood.

## 2024-07-10 ENCOUNTER — Ambulatory Visit: Payer: Self-pay | Admitting: Family Medicine

## 2024-07-10 ENCOUNTER — Other Ambulatory Visit: Payer: Self-pay

## 2024-07-10 DIAGNOSIS — Z1212 Encounter for screening for malignant neoplasm of rectum: Secondary | ICD-10-CM

## 2024-07-10 LAB — COMPREHENSIVE METABOLIC PANEL WITH GFR
AG Ratio: 1.2 (calc) (ref 1.0–2.5)
ALT: 14 U/L (ref 6–29)
AST: 25 U/L (ref 10–35)
Albumin: 3.7 g/dL (ref 3.6–5.1)
Alkaline phosphatase (APISO): 144 U/L (ref 37–153)
BUN: 13 mg/dL (ref 7–25)
CO2: 26 mmol/L (ref 20–32)
Calcium: 8.6 mg/dL (ref 8.6–10.4)
Chloride: 109 mmol/L (ref 98–110)
Creat: 0.9 mg/dL (ref 0.50–1.05)
Globulin: 3 g/dL (ref 1.9–3.7)
Glucose, Bld: 70 mg/dL (ref 65–99)
Potassium: 4.2 mmol/L (ref 3.5–5.3)
Sodium: 142 mmol/L (ref 135–146)
Total Bilirubin: 0.4 mg/dL (ref 0.2–1.2)
Total Protein: 6.7 g/dL (ref 6.1–8.1)
eGFR: 71 mL/min/1.73m2 (ref >=60)

## 2024-07-10 LAB — CBC WITH DIFFERENTIAL/PLATELET
Absolute Lymphocytes: 1623 {cells}/uL (ref 850–3900)
Absolute Monocytes: 413 {cells}/uL (ref 200–950)
Basophils Absolute: 18 {cells}/uL (ref 0–200)
Basophils Relative: 0.3 %
Eosinophils Absolute: 212 {cells}/uL (ref 15–500)
Eosinophils Relative: 3.6 %
HCT: 34.5 % — ABNORMAL LOW (ref 35.0–45.0)
Hemoglobin: 10.9 g/dL — ABNORMAL LOW (ref 11.7–15.5)
MCH: 27.5 pg (ref 27.0–33.0)
MCHC: 31.6 g/dL — ABNORMAL LOW (ref 32.0–36.0)
MCV: 86.9 fL (ref 80.0–100.0)
MPV: 10.6 fL (ref 7.5–12.5)
Monocytes Relative: 7 %
Neutro Abs: 3634 {cells}/uL (ref 1500–7800)
Neutrophils Relative %: 61.6 %
Platelets: 281 Thousand/uL (ref 140–400)
RBC: 3.97 Million/uL (ref 3.80–5.10)
RDW: 12.1 % (ref 11.0–15.0)
Total Lymphocyte: 27.5 %
WBC: 5.9 Thousand/uL (ref 3.8–10.8)

## 2024-07-10 LAB — IRON: Iron: 75 ug/dL (ref 45–160)

## 2024-07-10 LAB — VITAMIN B12: Vitamin B-12: 538 pg/mL (ref 200–1100)

## 2024-07-17 ENCOUNTER — Telehealth: Payer: Self-pay

## 2024-07-17 ENCOUNTER — Other Ambulatory Visit: Payer: Self-pay | Admitting: Family Medicine

## 2024-07-17 LAB — FECAL GLOBIN BY IMMUNOCHEMISTRY
FECAL GLOBIN RESULT:: NOT DETECTED
MICRO NUMBER:: 16922980
SPECIMEN QUALITY:: ADEQUATE

## 2024-07-17 MED ORDER — NIRMATRELVIR/RITONAVIR (PAXLOVID)TABLET: 3.0000 | ORAL_TABLET | Freq: Two times a day (BID) | ORAL | 0 refills | Status: AC

## 2024-07-17 NOTE — Telephone Encounter (Signed)
 Copied from CRM [id number]. Topic: Clinical - Medical Advice >> Jul 17, 2024  8:29 AM Valerie Stuart wrote: Reason for CRM: Pt states she tested positive for COVID 9/4 with chills, sore throat, muscle aches, fatigue, no appetite-have not eaten since Tuesday, fever, sore throat-symptoms began on Wednesday.  Would like advice on what she should do as this is her first time with COVID.  If we call something in for her, she would like to use this pharmacy:  [facility] [address] [place] - [address] [place] [address] Phone: [phone] Fax: [phone] Hours: Not open 24 hours  Patient callback is [phone]

## 2024-07-27 ENCOUNTER — Other Ambulatory Visit (HOSPITAL_BASED_OUTPATIENT_CLINIC_OR_DEPARTMENT_OTHER): Payer: Self-pay | Admitting: Obstetrics and Gynecology

## 2024-07-27 DIAGNOSIS — Z13820 Encounter for screening for osteoporosis: Secondary | ICD-10-CM

## 2024-09-30 ENCOUNTER — Encounter: Payer: Self-pay | Admitting: Family Medicine

## 2024-09-30 LAB — HM MAMMOGRAPHY
